# Patient Record
Sex: Female | Born: 1960
Health system: Southern US, Community
[De-identification: ages and names within clinical notes are randomized; demographics above are authoritative.]

## PROBLEM LIST (undated history)

## (undated) DIAGNOSIS — I10 Essential (primary) hypertension: Secondary | ICD-10-CM

## (undated) DIAGNOSIS — T7840XA Allergy, unspecified, initial encounter: Secondary | ICD-10-CM

## (undated) HISTORY — DX: Allergy, unspecified, initial encounter: T78.40XA

## (undated) HISTORY — PX: POLYPECTOMY: SHX149

## (undated) HISTORY — PX: BREAST BIOPSY: SHX20

## (undated) HISTORY — PX: BREAST SURGERY: SHX581

## (undated) HISTORY — PX: BREAST ENHANCEMENT SURGERY: SHX7

## (undated) HISTORY — PX: COLONOSCOPY: SHX174

## (undated) HISTORY — DX: Essential (primary) hypertension: I10

---

## 1998-11-09 ENCOUNTER — Other Ambulatory Visit: Admission: RE | Admit: 1998-11-09 | Discharge: 1998-11-09 | Payer: Self-pay | Admitting: Obstetrics and Gynecology

## 1999-05-19 ENCOUNTER — Other Ambulatory Visit: Admission: RE | Admit: 1999-05-19 | Discharge: 1999-05-19 | Payer: Self-pay | Admitting: *Deleted

## 1999-12-23 ENCOUNTER — Other Ambulatory Visit: Admission: RE | Admit: 1999-12-23 | Discharge: 1999-12-23 | Payer: Self-pay | Admitting: Obstetrics and Gynecology

## 2001-01-02 ENCOUNTER — Other Ambulatory Visit: Admission: RE | Admit: 2001-01-02 | Discharge: 2001-01-02 | Payer: Self-pay | Admitting: Obstetrics and Gynecology

## 2002-02-28 ENCOUNTER — Other Ambulatory Visit: Admission: RE | Admit: 2002-02-28 | Discharge: 2002-02-28 | Payer: Self-pay | Admitting: Obstetrics and Gynecology

## 2003-02-27 ENCOUNTER — Encounter: Payer: Self-pay | Admitting: Family Medicine

## 2003-02-27 ENCOUNTER — Ambulatory Visit (HOSPITAL_COMMUNITY): Admission: RE | Admit: 2003-02-27 | Discharge: 2003-02-27 | Payer: Self-pay | Admitting: Family Medicine

## 2003-03-03 ENCOUNTER — Encounter (HOSPITAL_COMMUNITY): Admission: RE | Admit: 2003-03-03 | Discharge: 2003-04-02 | Payer: Self-pay | Admitting: Family Medicine

## 2003-03-05 ENCOUNTER — Encounter: Payer: Self-pay | Admitting: Family Medicine

## 2004-03-08 ENCOUNTER — Other Ambulatory Visit: Admission: RE | Admit: 2004-03-08 | Discharge: 2004-03-08 | Payer: Self-pay | Admitting: Obstetrics and Gynecology

## 2011-05-12 ENCOUNTER — Encounter: Payer: Self-pay | Admitting: Internal Medicine

## 2011-05-12 ENCOUNTER — Ambulatory Visit (AMBULATORY_SURGERY_CENTER): Payer: 59 | Admitting: *Deleted

## 2011-05-12 VITALS — Ht 64.0 in | Wt 143.0 lb

## 2011-05-12 DIAGNOSIS — Z1211 Encounter for screening for malignant neoplasm of colon: Secondary | ICD-10-CM

## 2011-05-12 MED ORDER — PEG-KCL-NACL-NASULF-NA ASC-C 100 G PO SOLR: ORAL | Status: DC

## 2011-05-25 ENCOUNTER — Encounter: Payer: Self-pay | Admitting: Internal Medicine

## 2011-05-25 ENCOUNTER — Ambulatory Visit (AMBULATORY_SURGERY_CENTER): Payer: 59 | Admitting: Internal Medicine

## 2011-05-25 VITALS — BP 125/84 | HR 86 | Temp 99.1°F | Resp 18 | Ht 64.0 in | Wt 135.0 lb

## 2011-05-25 DIAGNOSIS — D126 Benign neoplasm of colon, unspecified: Secondary | ICD-10-CM

## 2011-05-25 DIAGNOSIS — Z1210 Encounter for screening for malignant neoplasm of intestinal tract, unspecified: Secondary | ICD-10-CM

## 2011-05-25 MED ORDER — SODIUM CHLORIDE 0.9 % IV SOLN: 500.0000 mL | INTRAVENOUS | Status: AC

## 2011-05-25 NOTE — Patient Instructions (Signed)
Findings:  Polyp  Recommendations:  Repeat colonoscopy in 5 years

## 2011-05-26 ENCOUNTER — Telehealth: Payer: Self-pay | Admitting: *Deleted

## 2011-05-26 NOTE — Telephone Encounter (Signed)

## 2013-08-02 ENCOUNTER — Encounter: Payer: Self-pay | Admitting: Family Medicine

## 2013-08-02 ENCOUNTER — Ambulatory Visit (INDEPENDENT_AMBULATORY_CARE_PROVIDER_SITE_OTHER): Payer: 59 | Admitting: Family Medicine

## 2013-08-02 VITALS — BP 122/88 | Ht 64.0 in | Wt 157.5 lb

## 2013-08-02 DIAGNOSIS — I10 Essential (primary) hypertension: Secondary | ICD-10-CM

## 2013-08-02 DIAGNOSIS — R002 Palpitations: Secondary | ICD-10-CM

## 2013-08-02 MED ORDER — METOPROLOL TARTRATE 25 MG PO TABS: ORAL_TABLET | ORAL | Status: DC

## 2013-08-02 NOTE — Progress Notes (Signed)
  Subjective:    Patient ID: Samantha George, female    DOB: 10/15/1961, 52 y.o.   MRN: [id number]  Hypertension This is a chronic problem. The current episode started more than 1 year ago. The problem has been gradually improving since onset. The problem is controlled. There are no associated agents to hypertension. There are no known risk factors for coronary artery disease. Treatments tried: metoprolol. The current treatment provides significant improvement. There are no compliance problems.   Patient states she has no other health concerns at this time.  Exercise going--t-mill and exercising retularly     Review of Systems No chest pain no back pain no shortness of breath compliant with medications. Blood pressures checked elsewhere at times related and other times normal.    Objective:   Physical Exam   Alert no acute distress. Lungs clear. Heart regular rate and rhythm. HEENT normal.     Assessment & Plan:  Impression #1 hypertension good control. #2 PVCs good control. Plan patient takes low-dose metoprolol initially primarily for PVCs but maintain for blood pressure. Generally just a half a tablet twice per day. Diet exercise discussed in encourage. Medications refilled. WSL

## 2013-08-04 DIAGNOSIS — I10 Essential (primary) hypertension: Secondary | ICD-10-CM | POA: Insufficient documentation

## 2013-08-04 DIAGNOSIS — R002 Palpitations: Secondary | ICD-10-CM | POA: Insufficient documentation

## 2014-08-27 ENCOUNTER — Encounter: Payer: Self-pay | Admitting: Family Medicine

## 2014-08-27 ENCOUNTER — Ambulatory Visit (INDEPENDENT_AMBULATORY_CARE_PROVIDER_SITE_OTHER): Payer: 59 | Admitting: Family Medicine

## 2014-08-27 VITALS — BP 120/78 | Ht 64.0 in | Wt 166.0 lb

## 2014-08-27 DIAGNOSIS — K219 Gastro-esophageal reflux disease without esophagitis: Secondary | ICD-10-CM

## 2014-08-27 DIAGNOSIS — R0789 Other chest pain: Secondary | ICD-10-CM

## 2014-08-27 DIAGNOSIS — I10 Essential (primary) hypertension: Secondary | ICD-10-CM

## 2014-08-27 DIAGNOSIS — R002 Palpitations: Secondary | ICD-10-CM

## 2014-08-27 MED ORDER — METOPROLOL TARTRATE 25 MG PO TABS: ORAL_TABLET | ORAL | Status: DC

## 2014-08-27 MED ORDER — RANITIDINE HCL 300 MG PO CAPS: 300.0000 mg | ORAL_CAPSULE | Freq: Every evening | ORAL | Status: DC

## 2014-08-27 MED ORDER — PANTOPRAZOLE SODIUM 40 MG PO TBEC: 40.0000 mg | DELAYED_RELEASE_TABLET | Freq: Every day | ORAL | Status: DC

## 2014-08-27 NOTE — Progress Notes (Signed)
   Subjective:    Patient ID: Samantha George, female    DOB: 11-Feb-1961, 53 y.o.   MRN: [id number]  Hypertension This is a chronic problem. Compliance problems include exercise.    Reflux worse at night while lying down. Burning in the middle of chest. Took zantac yesterday and feels better today.   Took a couple zantac  Exercise not good  No pain with exertion  Still has gall bladeder  Diet coke one or two or three per d  Felt slightly light headed  No viral infxn  Anti inflam prn     Burning pressure sens in the chest  Happening during the day  kinda sounded like reflux  Tried a few almonds  Then it restarted  Ate saltines   Declines flu vaccine today. Pt wants to wait til later in October.    Review of Systems See below    Objective:   Physical Exam  Alert no acute distress. Vitals stable. HEENT normal. Blood pressure good on repeat. Lungs clear. Heart regular in rhythm. Abdomen benign.  EKG normal sinus rhythm. No significant ST-T changes.      Assessment & Plan:  No headache no abdominal pain no change about habits no blood in stool next  Impression chest pain highly doubt cardiac etiology discussed at length. Most likely gastrointestinal with spasm secondary to reflux. #2 reflux discussed. Proton pump inhibitor to start and then H2-blocker to maintain. Rationale discussed. #3 hypertension good control plan as per orders. Maintain meds. Diet exercise discussed. WSL

## 2014-08-30 DIAGNOSIS — K219 Gastro-esophageal reflux disease without esophagitis: Secondary | ICD-10-CM | POA: Insufficient documentation

## 2015-03-30 ENCOUNTER — Ambulatory Visit (INDEPENDENT_AMBULATORY_CARE_PROVIDER_SITE_OTHER): Payer: 59 | Admitting: Family Medicine

## 2015-03-30 ENCOUNTER — Encounter: Payer: Self-pay | Admitting: Family Medicine

## 2015-03-30 VITALS — BP 136/88 | Temp 98.8°F | Ht 64.0 in | Wt 173.0 lb

## 2015-03-30 DIAGNOSIS — J329 Chronic sinusitis, unspecified: Secondary | ICD-10-CM

## 2015-03-30 DIAGNOSIS — J310 Chronic rhinitis: Secondary | ICD-10-CM

## 2015-03-30 MED ORDER — HYDROCODONE-HOMATROPINE 5-1.5 MG/5ML PO SYRP: ORAL_SOLUTION | ORAL | Status: DC

## 2015-03-30 MED ORDER — CEFDINIR 300 MG PO CAPS: 300.0000 mg | ORAL_CAPSULE | Freq: Two times a day (BID) | ORAL | Status: DC

## 2015-03-30 NOTE — Progress Notes (Signed)
   Subjective:    Patient ID: Samantha George, female    DOB: December 12, 1960, 54 y.o.   MRN: [id number]  Cough This is a new problem. Episode onset: 1 week. Associated symptoms include a sore throat. Treatments tried: clairtin, advil.    Started last wk with coiugh and runny nose  Due to fly soon, resp infxn f  Some press ure in ears  Bad cough  Morning sore throat present  Dim energy     Review of Systems  HENT: Positive for sore throat.   Respiratory: Positive for cough.     no vomiting no diarrhea    Objective:   Physical Exam   alert mild malaise. H&T frontal maxillary congestion fullness. Pharynx erythematous neck supple TMs normal lungs intermittent cough no wheezes no crackles heart regular in rhythm      Assessment & Plan:   impression viral syndrome with secondary rhinosinusitis plan antibiotics prescribed. Symptom care discussed. Intervention discussed with planned plane trip WSL

## 2015-09-01 ENCOUNTER — Other Ambulatory Visit: Payer: Self-pay | Admitting: Family Medicine

## 2015-09-01 NOTE — Telephone Encounter (Signed)
Ok one mo needs ov

## 2015-09-01 NOTE — Telephone Encounter (Signed)
Last chronic visit September 2015. May I refill?

## 2015-12-12 ENCOUNTER — Other Ambulatory Visit: Payer: Self-pay | Admitting: Family Medicine

## 2015-12-14 NOTE — Telephone Encounter (Signed)
Omne mo worth, rec f u visit to assess

## 2016-01-18 ENCOUNTER — Ambulatory Visit (INDEPENDENT_AMBULATORY_CARE_PROVIDER_SITE_OTHER): Payer: 59 | Admitting: Family Medicine

## 2016-01-18 ENCOUNTER — Encounter: Payer: Self-pay | Admitting: Family Medicine

## 2016-01-18 VITALS — BP 140/90 | Temp 98.3°F | Ht 64.0 in | Wt 164.5 lb

## 2016-01-18 DIAGNOSIS — I10 Essential (primary) hypertension: Secondary | ICD-10-CM

## 2016-01-18 MED ORDER — METOPROLOL TARTRATE 25 MG PO TABS: ORAL_TABLET | ORAL | Status: DC

## 2016-01-18 NOTE — Progress Notes (Signed)
   Subjective:    Patient ID: Samantha George, female    DOB: 03-Oct-1961, 55 y.o.   MRN: [id number]  Hypertension This is a chronic problem. The current episode started more than 1 year ago. The problem is uncontrolled. There are no associated agents to hypertension. There are no known risk factors for coronary artery disease. Treatments tried: metoprolol. The current treatment provides no improvement. There are no compliance problems.    Blood pressure has been running high lately. BP 120ish over 80ish, mostly when ck'ed elsewhere  150 over 102 in the surg's office, nmber still staye dup  Patient has no other concerns at this time.   Still elevated some   For sthe past four days has been on one tab bid since fricday   Hx of heart flutters, no obv fam hx of htn , but cad in fa plus smoker   Review of Systems No headache no chest pain no back pain abdominal pain ROS otherwise negative    Objective:   Physical Exam Alert vitals stable. HEENT normal lungs clear. Heart regular in rhythm.       Assessment & Plan:  Impression hypertension suboptimal control plan increase to full 25 mg twice a day long-term. Symptom care discussed prescriptions given WSL

## 2016-02-02 ENCOUNTER — Other Ambulatory Visit: Payer: Self-pay | Admitting: Plastic Surgery

## 2016-05-16 ENCOUNTER — Encounter: Payer: Self-pay | Admitting: Internal Medicine

## 2016-08-03 ENCOUNTER — Encounter: Payer: Self-pay | Admitting: Family Medicine

## 2016-08-03 ENCOUNTER — Ambulatory Visit (INDEPENDENT_AMBULATORY_CARE_PROVIDER_SITE_OTHER): Payer: 59 | Admitting: Family Medicine

## 2016-08-03 VITALS — BP 136/90 | Ht 64.0 in | Wt 168.1 lb

## 2016-08-03 DIAGNOSIS — R002 Palpitations: Secondary | ICD-10-CM

## 2016-08-03 DIAGNOSIS — Z79899 Other long term (current) drug therapy: Secondary | ICD-10-CM | POA: Diagnosis not present

## 2016-08-03 DIAGNOSIS — I10 Essential (primary) hypertension: Secondary | ICD-10-CM

## 2016-08-03 DIAGNOSIS — Z13220 Encounter for screening for lipoid disorders: Secondary | ICD-10-CM | POA: Diagnosis not present

## 2016-08-03 MED ORDER — ENALAPRIL MALEATE 5 MG PO TABS: 5.0000 mg | ORAL_TABLET | Freq: Every day | ORAL | 5 refills | Status: DC

## 2016-08-03 NOTE — Progress Notes (Signed)
   Subjective:    Patient ID: Samantha George, female    DOB: November 11, 1961, 55 y.o.   MRN: [id number]  Hypertension  This is a chronic problem. The current episode started more than 1 year ago. Associated symptoms include palpitations. There are no compliance problems.    Sometimes the bottom numb is less than 85, but most of the time higher  Noticing more palpitations  Jumping wends ation  Mild elevation  dcame thru ok  Trying to watch diet, Patient still notes palpitations at time. Notes it as a skipping sensation. Often feels a transient fullness in the chest. This concerns her a bit.  Her blood pressures been consistently elevated each time she checks.  Not checking blood pressures at home but at the drug store and elsewhere Patient states no other concerns this visit.  Review of Systems  Cardiovascular: Positive for palpitations.  No headache, no major weight loss or weight gain, no chest pain no back pain abdominal pain no change in bowel habits complete ROS otherwise negative      Objective:   Physical Exam Vital stable blood pressure 142/88 to 92 on repeat lungs clear. Heart regular in rhythm       Assessment & Plan:  Patient still notes palpitations at time. Impression 1 hypertension controlled suboptimum #2 PVCs intermittent somewhat bothersome plan a full 25 minutes spent in discussion with this patient about all of her options what's the best weight to proceed. The fullness from the palpitations is within normal limits I described the mechanism of this tumor. We could increase beta blocker but too much risk of fatigue. I discussed ways recommendations regarding blood pressure management. We will add a angiotensin cholinesterase inhibitor. Daily at bedtime. Maintain same dose of beta blocker rationale discussed. Patient will call us in several weeks. Otherwise follow-up in 6 months

## 2016-08-03 NOTE — Patient Instructions (Signed)
lifesource automated bp monitor

## 2016-12-12 ENCOUNTER — Encounter: Payer: Self-pay | Admitting: Internal Medicine

## 2017-01-22 ENCOUNTER — Other Ambulatory Visit: Payer: Self-pay | Admitting: Family Medicine

## 2017-02-10 ENCOUNTER — Ambulatory Visit (AMBULATORY_SURGERY_CENTER): Payer: Self-pay | Admitting: *Deleted

## 2017-02-10 VITALS — Ht 65.0 in | Wt 173.0 lb

## 2017-02-10 DIAGNOSIS — Z86010 Personal history of colonic polyps: Secondary | ICD-10-CM

## 2017-02-10 MED ORDER — NA SULFATE-K SULFATE-MG SULF 17.5-3.13-1.6 GM/177ML PO SOLN: 1.0000 | Freq: Once | ORAL | 0 refills | Status: AC

## 2017-02-10 NOTE — Progress Notes (Signed)
No egg or soy allergy known to patient  No issues with past sedation with any surgeries  or procedures, no intubation problems  No diet pills per patient No home 02 use per patient  No blood thinners per patient  Pt denies issues with constipation  No A fib or A flutter   

## 2017-02-13 ENCOUNTER — Other Ambulatory Visit: Payer: Self-pay | Admitting: Family Medicine

## 2017-02-13 ENCOUNTER — Encounter: Payer: Self-pay | Admitting: Internal Medicine

## 2017-02-21 ENCOUNTER — Ambulatory Visit (AMBULATORY_SURGERY_CENTER): Payer: 59 | Admitting: Internal Medicine

## 2017-02-21 ENCOUNTER — Encounter: Payer: Self-pay | Admitting: Internal Medicine

## 2017-02-21 VITALS — BP 113/74 | HR 58 | Temp 98.9°F | Resp 16 | Ht 65.0 in | Wt 173.0 lb

## 2017-02-21 DIAGNOSIS — Z86010 Personal history of colonic polyps: Secondary | ICD-10-CM | POA: Diagnosis present

## 2017-02-21 MED ORDER — SODIUM CHLORIDE 0.9 % IV SOLN: 500.0000 mL | INTRAVENOUS | Status: AC

## 2017-02-21 NOTE — Progress Notes (Signed)
To PACU VSS. Report to RN.tb 

## 2017-02-21 NOTE — Op Note (Signed)
[facility] Patient Name: Samantha George Procedure Date: 02/21/2017 1:26 PM MRN: [id number] Endoscopist: Docia Chuck. Henrene Pastor , MD Age: 56 Referring MD:  Date of Birth: 01-07-1961 Gender: Female Account #: [id number] Procedure:                Colonoscopy Indications:              High risk colon cancer surveillance: Personal                            history of sessile serrated colon polyp (less than                            10 mm in size) with no dysplasia. Previous exam                            June 2012 Medicines:                Monitored Anesthesia Care Procedure:                Pre-Anesthesia Assessment:                           - Prior to the procedure, a History and Physical                            was performed, and patient medications and                            allergies were reviewed. The patient's tolerance of                            previous anesthesia was also reviewed. The risks                            and benefits of the procedure and the sedation                            options and risks were discussed with the patient.                            All questions were answered, and informed consent                            was obtained. Prior Anticoagulants: The patient has                            taken no previous anticoagulant or antiplatelet                            agents. ASA Grade Assessment: II - A patient with                            mild systemic disease. After reviewing the risks  and benefits, the patient was deemed in                            satisfactory condition to undergo the procedure.                           After obtaining informed consent, the colonoscope                            was passed under direct vision. Throughout the                            procedure, the patient's blood pressure, pulse, and                            oxygen saturations were monitored continuously. The                             Colonoscope was introduced through the anus and                            advanced to the the cecum, identified by                            appendiceal orifice and ileocecal valve. The                            ileocecal valve, appendiceal orifice, and rectum                            were photographed. The quality of the bowel                            preparation was excellent. The colonoscopy was                            performed without difficulty. The patient tolerated                            the procedure well. The bowel preparation used was                            SUPREP. Scope In: 1:35:01 PM Scope Out: 1:46:27 PM Scope Withdrawal Time: 0 hours 9 minutes 54 seconds  Total Procedure Duration: 0 hours 11 minutes 26 seconds  Findings:                 The entire examined colon appeared normal on direct                            and retroflexion views. Complications:            No immediate complications. Estimated blood loss:                            None.  Estimated Blood Loss:     Estimated blood loss: none. Impression:               - The entire examined colon is normal on direct and                            retroflexion views.                           - No specimens collected. Recommendation:           - Repeat colonoscopy in 10 years for surveillance.                           - Patient has a contact number available for                            emergencies. The signs and symptoms of potential                            delayed complications were discussed with the                            patient. Return to normal activities tomorrow.                            Written discharge instructions were provided to the                            patient.                           - Resume previous diet.                           - Continue present medications. Docia Chuck. Henrene Pastor, MD 02/21/2017 1:50:07 PM This report has been signed  electronically.

## 2017-02-21 NOTE — Patient Instructions (Signed)
YOU HAD AN ENDOSCOPIC PROCEDURE TODAY AT THE [facility]:   Refer to the procedure report that was given to you for any specific questions about what was found during the examination.  If the procedure report does not answer your questions, please call your gastroenterologist to clarify.  If you requested that your care partner not be given the details of your procedure findings, then the procedure report has been included in a sealed envelope for you to review at your convenience later.  YOU SHOULD EXPECT: Some feelings of bloating in the abdomen. Passage of more gas than usual.  Walking can help get rid of the air that was put into your GI tract during the procedure and reduce the bloating. If you had a lower endoscopy (such as a colonoscopy or flexible sigmoidoscopy) you may notice spotting of blood in your stool or on the toilet paper. If you underwent a bowel prep for your procedure, you may not have a normal bowel movement for a few days.  Please Note:  You might notice some irritation and congestion in your nose or some drainage.  This is from the oxygen used during your procedure.  There is no need for concern and it should clear up in a day or so.  SYMPTOMS TO REPORT IMMEDIATELY:   Following lower endoscopy (colonoscopy or flexible sigmoidoscopy):  Excessive amounts of blood in the stool  Significant tenderness or worsening of abdominal pains  Swelling of the abdomen that is new, acute  Fever of 100F or higher  For urgent or emergent issues, a gastroenterologist can be reached at any hour by calling [phone].   DIET:  We do recommend a small meal at first, but then you may proceed to your regular diet.  Drink plenty of fluids but you should avoid alcoholic beverages for 24 hours.  ACTIVITY:  You should plan to take it easy for the rest of today and you should NOT DRIVE or use heavy machinery until tomorrow (because of the sedation medicines used during the test).     FOLLOW UP: Our staff will call the number listed on your records the next business day following your procedure to check on you and address any questions or concerns that you may have regarding the information given to you following your procedure. If we do not reach you, we will leave a message.  However, if you are feeling well and you are not experiencing any problems, there is no need to return our call.  We will assume that you have returned to your regular daily activities without incident.  If any biopsies were taken you will be contacted by phone or by letter within the next 1-3 weeks.  Please call us at [phone] if you have not heard about the biopsies in 3 weeks.    SIGNATURES/CONFIDENTIALITY: You and/or your care partner have signed paperwork which will be entered into your electronic medical record.  These signatures attest to the fact that that the information above on your After Visit Summary has been reviewed and is understood.  Full responsibility of the confidentiality of this discharge information lies with you and/or your care-partner. 

## 2017-02-22 ENCOUNTER — Telehealth: Payer: Self-pay

## 2017-02-22 NOTE — Telephone Encounter (Signed)
  Follow up Call-  Call back number 02/21/2017  Post procedure Call Back phone  # [phone]  Permission to leave phone message Yes  Some recent data might be hidden     Patient questions:  Do you have a fever, pain , or abdominal swelling? No. Pain Score  0 *  Have you tolerated food without any problems? Yes.    Have you been able to return to your normal activities? Yes.    Do you have any questions about your discharge instructions: Diet   No. Medications  No. Follow up visit  No.  Do you have questions or concerns about your Care? No.  Actions: * If pain score is 4 or above: No action needed, pain <4.

## 2017-03-16 ENCOUNTER — Other Ambulatory Visit: Payer: Self-pay | Admitting: Family Medicine

## 2017-04-03 ENCOUNTER — Other Ambulatory Visit: Payer: Self-pay | Admitting: Family Medicine

## 2017-04-14 DIAGNOSIS — I10 Essential (primary) hypertension: Secondary | ICD-10-CM | POA: Diagnosis not present

## 2017-05-17 DIAGNOSIS — I10 Essential (primary) hypertension: Secondary | ICD-10-CM | POA: Diagnosis not present

## 2017-05-19 DIAGNOSIS — N951 Menopausal and female climacteric states: Secondary | ICD-10-CM | POA: Diagnosis not present

## 2017-05-19 DIAGNOSIS — I10 Essential (primary) hypertension: Secondary | ICD-10-CM | POA: Diagnosis not present

## 2017-05-19 DIAGNOSIS — R002 Palpitations: Secondary | ICD-10-CM | POA: Diagnosis not present

## 2017-05-22 DIAGNOSIS — J069 Acute upper respiratory infection, unspecified: Secondary | ICD-10-CM | POA: Diagnosis not present

## 2017-05-24 DIAGNOSIS — R05 Cough: Secondary | ICD-10-CM | POA: Diagnosis not present

## 2017-05-24 DIAGNOSIS — J0100 Acute maxillary sinusitis, unspecified: Secondary | ICD-10-CM | POA: Diagnosis not present

## 2017-06-06 DIAGNOSIS — Z1231 Encounter for screening mammogram for malignant neoplasm of breast: Secondary | ICD-10-CM | POA: Diagnosis not present

## 2017-07-11 DIAGNOSIS — Z01419 Encounter for gynecological examination (general) (routine) without abnormal findings: Secondary | ICD-10-CM | POA: Diagnosis not present

## 2017-11-14 DIAGNOSIS — I10 Essential (primary) hypertension: Secondary | ICD-10-CM | POA: Diagnosis not present

## 2017-11-14 DIAGNOSIS — R002 Palpitations: Secondary | ICD-10-CM | POA: Diagnosis not present

## 2017-12-03 ENCOUNTER — Encounter (HOSPITAL_COMMUNITY): Payer: Self-pay | Admitting: *Deleted

## 2017-12-03 ENCOUNTER — Other Ambulatory Visit: Payer: Self-pay

## 2017-12-03 ENCOUNTER — Emergency Department (HOSPITAL_COMMUNITY): Payer: 59

## 2017-12-03 DIAGNOSIS — R079 Chest pain, unspecified: Secondary | ICD-10-CM | POA: Insufficient documentation

## 2017-12-03 DIAGNOSIS — R0789 Other chest pain: Secondary | ICD-10-CM | POA: Diagnosis not present

## 2017-12-03 DIAGNOSIS — Z79899 Other long term (current) drug therapy: Secondary | ICD-10-CM | POA: Insufficient documentation

## 2017-12-03 DIAGNOSIS — I10 Essential (primary) hypertension: Secondary | ICD-10-CM | POA: Insufficient documentation

## 2017-12-03 LAB — CBC
HEMATOCRIT: 40.8 % (ref 36.0–46.0)
HEMOGLOBIN: 13.2 g/dL (ref 12.0–15.0)
MCH: 30.4 pg (ref 26.0–34.0)
MCHC: 32.4 g/dL (ref 30.0–36.0)
MCV: 94 fL (ref 78.0–100.0)
Platelets: 261 10*3/uL (ref 150–400)
RBC: 4.34 MIL/uL (ref 3.87–5.11)
RDW: 12.9 % (ref 11.5–15.5)
WBC: 6.6 10*3/uL (ref 4.0–10.5)

## 2017-12-03 LAB — BASIC METABOLIC PANEL
ANION GAP: 11 (ref 5–15)
BUN: 22 mg/dL — ABNORMAL HIGH (ref 6–20)
CHLORIDE: 102 mmol/L (ref 101–111)
CO2: 24 mmol/L (ref 22–32)
Calcium: 9.3 mg/dL (ref 8.9–10.3)
Creatinine, Ser: 0.72 mg/dL (ref 0.44–1.00)
GFR calc non Af Amer: >60 mL/min (ref >60)
Glucose, Bld: 128 mg/dL — ABNORMAL HIGH (ref 65–99)
POTASSIUM: 3.3 mmol/L — AB (ref 3.5–5.1)
SODIUM: 137 mmol/L (ref 135–145)

## 2017-12-03 LAB — TROPONIN I: Troponin I: <0.03 ng/mL (ref <0.03)

## 2017-12-03 NOTE — ED Triage Notes (Signed)
Pt reports pressure in her chest since this afternoon. Pt denies any other symptoms.

## 2017-12-04 ENCOUNTER — Emergency Department (HOSPITAL_COMMUNITY)
Admission: EM | Admit: 2017-12-04 | Discharge: 2017-12-04 | Disposition: A | Discharge status: Home / Self Care | Payer: 59 | Attending: Emergency Medicine | Admitting: Emergency Medicine

## 2017-12-04 DIAGNOSIS — R0789 Other chest pain: Secondary | ICD-10-CM

## 2017-12-04 LAB — TROPONIN I

## 2017-12-04 MED ORDER — NITROGLYCERIN 0.4 MG SL SUBL
0.4000 mg | SUBLINGUAL_TABLET | SUBLINGUAL | Status: DC | PRN
  Administered 2017-12-04: 0.4 mg via SUBLINGUAL
  Filled 2017-12-04: qty 1

## 2017-12-04 MED ORDER — ASPIRIN 81 MG PO CHEW
324.0000 mg | CHEWABLE_TABLET | Freq: Once | ORAL | Status: AC
  Administered 2017-12-04: 324 mg via ORAL
  Filled 2017-12-04: qty 4

## 2017-12-04 MED ORDER — NITROGLYCERIN 0.4 MG SL SUBL: 0.4000 mg | SUBLINGUAL_TABLET | SUBLINGUAL | Status: DC | PRN

## 2017-12-04 MED ORDER — NITROGLYCERIN 0.4 MG SL SUBL: 0.4000 mg | SUBLINGUAL_TABLET | SUBLINGUAL | 0 refills | Status: AC | PRN

## 2017-12-04 MED ORDER — POTASSIUM CHLORIDE CRYS ER 20 MEQ PO TBCR
40.0000 meq | EXTENDED_RELEASE_TABLET | Freq: Once | ORAL | Status: AC
  Administered 2017-12-04: 40 meq via ORAL
  Filled 2017-12-04: qty 2

## 2017-12-04 NOTE — Discharge Instructions (Signed)
Take aspirin 81 mg once a day. If you ever have chest discomfort that does not get better with three nitroglycerin tablets, then come to the emergency department immediately!

## 2017-12-04 NOTE — ED Provider Notes (Signed)
[facility] EMERGENCY DEPARTMENT Provider Note   CSN: [id number] Arrival date & time: 12/03/17  2158      History   Chief Complaint Chief Complaint  Patient presents with  . Chest Pain    HPI Samantha George is a 57 y.o. female.  The history is provided by the patient.  She has a history of hypertension and palpitations, and comes in because of tight feeling in her chest since this afternoon.  This came on while she was doing some household chores.  There is no radiation of the discomfort and it is relatively mild and she rates it at 2/10.  Nothing makes it better, nothing makes it worse.  It is not affected by exertion or body position.  There is no associated dyspnea, nausea, diaphoresis.  She has not had similar symptoms before.  She is a non-smoker and denies history of diabetes or hyperlipidemia.  Father died of an MI at age 34, but was a heavy smoker.  Past Medical History:  Diagnosis Date  . Allergy    seasonal  . Hypertension     Patient Active Problem List   Diagnosis Date Noted  . Esophageal reflux 08/30/2014  . Essential hypertension, benign 08/04/2013  . Palpitations 08/04/2013    Past Surgical History:  Procedure Laterality Date  . BREAST BIOPSY     left/benign  . BREAST ENHANCEMENT SURGERY    . BREAST SURGERY     implants removed  . CESAREAN SECTION    . COLONOSCOPY    . POLYPECTOMY      OB History    No data available       Home Medications    Prior to Admission medications   Medication Sig Start Date End Date Taking? Authorizing Provider  enalapril (VASOTEC) 5 MG tablet take 1 tablet by mouth every morning 04/03/17   Mikey Kirschner, MD  metoprolol tartrate (LOPRESSOR) 25 MG tablet take 1/2 to 1 tablet by mouth twice a day 03/17/17   Mikey Kirschner, MD  Probiotic Product (PROBIOTIC DAILY PO) Take by mouth daily. Reported on 01/18/2016    [provider]    Family History Family History  Problem Relation Age of Onset  . Stomach  cancer Maternal Grandfather   . Colon cancer Neg Hx   . Colon polyps Neg Hx   . Rectal cancer Neg Hx     Social History Social History   Tobacco Use  . Smoking status: Never Smoker  . Smokeless tobacco: Never Used  Substance Use Topics  . Alcohol use: Yes    Alcohol/week: 0.0 oz    Comment: socially on weekends   . Drug use: No     Allergies   Patient has no known allergies.   Review of Systems Review of Systems  All other systems reviewed and are negative.    Physical Exam Updated Vital Signs BP (!) 145/100 (BP Location: Right Arm)   Pulse (!) 113   Temp 98.5 F (36.9 C) (Oral)   Resp 20   Ht 5\' 4"  (1.626 m)   Wt 79.1 kg (174 lb 4.8 oz)   LMP 05/06/2011   SpO2 98%   BMI 29.92 kg/m   Physical Exam  Nursing note and vitals reviewed.   57 year old female, resting comfortably and in no acute distress. Vital signs are significant for hypertension and tachycardia. Oxygen saturation is 98%, which is normal. Head is normocephalic and atraumatic. PERRLA, EOMI. Oropharynx is clear. Neck is nontender  and supple without adenopathy or JVD. Back is nontender and there is no CVA tenderness. Lungs are clear without rales, wheezes, or rhonchi. Chest is nontender. Heart has regular rate and rhythm without murmur. Abdomen is soft, flat, nontender without masses or hepatosplenomegaly and peristalsis is normoactive. Extremities have no cyanosis or edema, full range of motion is present. Skin is warm and dry without rash. Neurologic: Mental status is normal, cranial nerves are intact, there are no motor or sensory deficits.  ED Treatments / Results  Labs (all labs ordered are listed, but only abnormal results are displayed) Labs Reviewed  BASIC METABOLIC PANEL - Abnormal; Notable for the following components:      Result Value   Potassium 3.3 (*)    Glucose, Bld 128 (*)    BUN 22 (*)    All other components within normal limits  CBC  TROPONIN I  TROPONIN I    EKG  Interpretation  Date/Time:  Sunday December 03 2017 22:40:49 EST Ventricular Rate:  72 PR Interval:  184 QRS Duration: 90 QT Interval:  442 QTC Calculation: 483 R Axis:   13 Text Interpretation:  Sinus rhythm Inferior infarct , age undetermined Anterior infarct , age undetermined No old tracing to compare Reconfirmed by Delora Fuel ([id number]) on 12/04/2017 4:31:09 AM        Radiology Dg Chest 2 View  Result Date: 12/04/2017 CLINICAL DATA:  Chest pressure since this afternoon. EXAM: CHEST  2 VIEW COMPARISON:  None. FINDINGS: The heart size and mediastinal contours are within normal limits. Tortuous thoracic aorta without aneurysm. Both lungs are clear. The visualized skeletal structures are unremarkable. IMPRESSION: No active cardiopulmonary disease. Mild uncoiling of the thoracic aorta without aneurysm. Electronically Signed   By: Ashley Royalty M.D.   On: 12/04/2017 00:02    Procedures Procedures (including critical care time)  Medications Ordered in ED Medications  nitroGLYCERIN (NITROSTAT) SL tablet 0.4 mg (0.4 mg Sublingual Given 12/04/17 0438)  nitroGLYCERIN (NITROSTAT) SL tablet 0.4 mg (not administered)  aspirin chewable tablet 324 mg (324 mg Oral Given 12/04/17 0436)  potassium chloride SA (K-DUR,KLOR-CON) CR tablet 40 mEq (40 mEq Oral Given 12/04/17 0436)     Initial Impression / Assessment and Plan / ED Course  I have reviewed the triage vital signs and the nursing notes.  Pertinent labs & imaging results that were available during my care of the patient were reviewed by me and considered in my medical decision making (see chart for details).  Chest discomfort of uncertain cause.  Heart score is 3, which puts her at low risk for major adverse cardiac events in the next 30 days.  Will check repeat troponin, give aspirin and give therapeutic trial of nitroglycerin.  She had relief of discomfort with nitroglycerin.  Repeat troponin is normal.  Patient expressed desire to leave.  I  have explained to her that even with negative ECG and 2- heart enzymes, coronary artery disease is still a possibility.  She expresses understanding.  She is instructed to take aspirin daily and is given a prescription for nitroglycerin with strict return precautions given.  Referred to cardiology for outpatient stress testing.  Final Clinical Impressions(s) / ED Diagnoses   Final diagnoses:  Chest discomfort    ED Discharge Orders    None       Delora Fuel, MD 54/56/25 [id number]

## 2017-12-04 NOTE — ED Notes (Signed)
Pt reporting continues pressure/fullnes in the center of her chest.

## 2017-12-04 NOTE — ED Notes (Signed)
This RN called Tori, lab, and asked her to come draw this pt's blood due to her being a chest pain and advised her that I had another chest pain in the waiting area. Tori, drew both pt's blood.

## 2017-12-06 DIAGNOSIS — I10 Essential (primary) hypertension: Secondary | ICD-10-CM | POA: Diagnosis not present

## 2018-01-15 ENCOUNTER — Encounter: Payer: Self-pay | Admitting: Internal Medicine

## 2018-01-15 ENCOUNTER — Ambulatory Visit (INDEPENDENT_AMBULATORY_CARE_PROVIDER_SITE_OTHER): Payer: 59 | Admitting: Internal Medicine

## 2018-01-15 VITALS — BP 124/78 | HR 76 | Ht 64.0 in | Wt 177.0 lb

## 2018-01-15 DIAGNOSIS — R0789 Other chest pain: Secondary | ICD-10-CM

## 2018-01-15 DIAGNOSIS — R002 Palpitations: Secondary | ICD-10-CM

## 2018-01-15 DIAGNOSIS — E782 Mixed hyperlipidemia: Secondary | ICD-10-CM | POA: Diagnosis not present

## 2018-01-15 DIAGNOSIS — I10 Essential (primary) hypertension: Secondary | ICD-10-CM

## 2018-01-15 MED ORDER — DILTIAZEM HCL 30 MG PO TABS: 30.0000 mg | ORAL_TABLET | Freq: Two times a day (BID) | ORAL | 6 refills | Status: DC

## 2018-01-15 MED ORDER — BYSTOLIC 10 MG PO TABS: 10.0000 mg | ORAL_TABLET | Freq: Every day | ORAL | 3 refills | Status: DC

## 2018-01-15 NOTE — Progress Notes (Signed)
Cardiology Office Note   Date:  01/15/2018   ID:  Samantha George, DOB June 22, 1961, MRN [id number]  PCP:  Celene Squibb, MD  Cardiologist:   Dorris Carnes, MD    Pt referred by Dr Nevada Crane for f/u of chest discomfort     History of Present Illness: Samantha George is a 57 y.o. female with a history of HTN and palpitations  She was seen in ED on 12/04/17 with chest tightness   Occurred while doing chores.  Mild 2/10  Describes as a heaviness  Some fluttering (fleeting)Not aossciated with change in positon or activity level  Lasted hours  No SOB     Seen by Dr Alvester Chou increased to 20 due to increased BP  She has not started. Still some fluttering and some tightness.  Continues daily activites  Travells a lot  Spells not exacerbated by actvitiy  Have not slowed her   Notes still some tightness an fluttering  She continues tht sam Father (a heavy smoker) died at 57 of MI     Current Meds  Medication Sig  . aspirin EC 81 MG tablet Take 81 mg by mouth daily.  . enalapril (VASOTEC) 5 MG tablet take 1 tablet by mouth every morning  . metoprolol tartrate (LOPRESSOR) 25 MG tablet take 1/2 to 1 tablet by mouth twice a day  . nitroGLYCERIN (NITROSTAT) 0.4 MG SL tablet Place 1 tablet (0.4 mg total) under the tongue every 5 (five) minutes as needed for chest pain.  . Probiotic Product (PROBIOTIC DAILY PO) Take by mouth daily. Reported on 01/18/2016   Current Facility-Administered Medications for the 01/15/18 encounter (Office Visit) with Fay Records, MD  Medication  . 0.9 %  sodium chloride infusion  . 0.9 %  sodium chloride infusion     Allergies:   Patient has no known allergies.   Past Medical History:  Diagnosis Date  . Allergy    seasonal  . Hypertension     Past Surgical History:  Procedure Laterality Date  . BREAST BIOPSY     left/benign  . BREAST ENHANCEMENT SURGERY    . BREAST SURGERY     implants removed  . CESAREAN SECTION    . COLONOSCOPY    . POLYPECTOMY       Social  History:  The patient  reports that  has never smoked. she has never used smokeless tobacco. She reports that she drinks alcohol. She reports that she does not use drugs.   Family History:  The patient's family history includes Stomach cancer in her maternal grandfather.     Dad had COPD   MI at 74  Died       ROS:  Please see the history of present illness. All other systems are reviewed and  Negative to the above problem except as noted.    PHYSICAL EXAM: VS:  BP 124/78 (BP Location: Left Arm)   Pulse 76   Ht 5\' 4"  (1.626 m)   Wt 177 lb (80.3 kg)   LMP 05/06/2011   SpO2 96%   BMI 30.38 kg/m   GEN: Well nourished, well developed, in no acute distress  HEENT: normal  Neck: no JVD, carotid bruits, or masses Cardiac: RRR; no murmurs, rubs, or gallops,no edema  Respiratory:  clear to auscultation bilaterally, normal work of breathing GI: soft, nontender, nondistended, + BS  No hepatomegaly  MS: no deformity Moving all extremities   Skin: warm and dry, no rash Neuro:  Strength and sensation are intact Psych: euthymic mood, full affect   EKG:  EKG is not ordered today.  From ED on 12/03/17 SR with nonspecific ST changes     Lipid Panel No results found for: CHOL, TRIG, HDL, CHOLHDL, VLDL, LDLCALC, LDLDIRECT    Wt Readings from Last 3 Encounters:  01/15/18 177 lb (80.3 kg)  12/03/17 174 lb 4.8 oz (79.1 kg)  02/21/17 173 lb (78.5 kg)      ASSESSMENT AND PLAN:  1  Chest tightness  Atypical for ischemia  Not associated with activity  Occasionally associated with palpitations   ? GI   I encouraged her to stay active   Follow   For palpitatoins I recomm trial of dilt 30 bid  Will see if this helps tightness  2  Palpitaitons   Occasional  Not hemodynamically destabilizing   Try low dose dilt  Follow  Stay adequately hydrated   Avoid caffeine  3  HTN  Follow BP  Good today  Keep on Bystolic for now 10 mg  4  Lipids   Check lipids today    Pt will communicate on my chart  response to changes  This will guide furhter Rx    Current medicines are reviewed at length with the patient today.  The patient does not have concerns regarding medicines.  Signed, Dorris Carnes, MD  01/15/2018 9:05 AM    [address], [place], [place]  [address] Phone: [phone]; Fax: [phone]

## 2018-01-15 NOTE — Patient Instructions (Signed)
Your physician recommends that you schedule a follow-up appointment in: to be determined per Dr Harrington Challenger    STOP Aspirin   START Diltiazem 30 mg twice a day   [place] lab work    No tests ordered today      Thank you for choosing [facility] !

## 2018-01-16 ENCOUNTER — Other Ambulatory Visit (HOSPITAL_COMMUNITY)
Admission: RE | Admit: 2018-01-16 | Discharge: 2018-01-16 | Disposition: A | Discharge status: Home / Self Care | Payer: 59 | Source: Ambulatory Visit | Attending: Internal Medicine | Admitting: Internal Medicine

## 2018-01-16 DIAGNOSIS — E782 Mixed hyperlipidemia: Secondary | ICD-10-CM | POA: Diagnosis present

## 2018-01-16 LAB — LIPID PANEL
CHOL/HDL RATIO: 3.2 ratio
CHOLESTEROL: 209 mg/dL — AB (ref 0–200)
HDL: 65 mg/dL (ref >40)
LDL Cholesterol: 123 mg/dL — ABNORMAL HIGH (ref 0–99)
TRIGLYCERIDES: 103 mg/dL (ref <150)
VLDL: 21 mg/dL (ref 0–40)

## 2018-01-21 ENCOUNTER — Encounter: Payer: Self-pay | Admitting: Internal Medicine

## 2018-02-02 ENCOUNTER — Telehealth: Payer: Self-pay | Admitting: Internal Medicine

## 2018-02-02 MED ORDER — DILTIAZEM HCL ER COATED BEADS 120 MG PO CP24: 120.0000 mg | ORAL_CAPSULE | Freq: Every day | ORAL | 3 refills | Status: DC

## 2018-02-02 MED ORDER — NEBIVOLOL HCL 5 MG PO TABS: 5.0000 mg | ORAL_TABLET | Freq: Every day | ORAL | 3 refills | Status: DC

## 2018-02-02 NOTE — Telephone Encounter (Signed)
Called patient. Diltiazem er is affordable.

## 2018-02-02 NOTE — Telephone Encounter (Signed)
Left message for patient to call back  

## 2018-02-02 NOTE — Telephone Encounter (Signed)
Reviewed with patient. She will go by her pharmacy and check on price of diltiazem CD 120.  I asked her to call back when she knows and at that time I will update her medicine list.  Voices understanding of planned med changes.

## 2018-02-02 NOTE — Telephone Encounter (Signed)
New message      *STAT* If patient is at the pharmacy, call can be transferred to refill team.   1. Which medications need to be refilled? (please list name of each medication and dose if known) BYSTOLIC 10 MG tablet  2. Which pharmacy/location (including street and city if local pharmacy) is medication to be sent to?[facility] [phone] - [place], [address] AT [address]  3. Do they need a 30 day or 90 day supply? [place]

## 2018-02-02 NOTE — Telephone Encounter (Signed)
  °*  STAT* If patient is at the pharmacy, call can be transferred to refill team.   1. Which medications need to be refilled? (please list name of each medication and dose if known) BYSTOLIC 10 MG tablet  2. Which pharmacy/location (including street and city if local pharmacy) is medication to be sent to?[facility] [phone] - [place], [address] AT [address]  3. Do they need a 30 day or 90 day supply? Samantha George

## 2018-02-02 NOTE — Addendum Note (Signed)
Addended by: Rodman Key on: 02/02/2018 11:12 AM   Modules accepted: Orders

## 2018-02-02 NOTE — Telephone Encounter (Signed)
Updated medicine list at this time. Pt now to take diltiazem ER 120 mg daily and 5 mg bystolic daily.

## 2018-02-02 NOTE — Telephone Encounter (Signed)
Pt wrote in  Feeling better with few skips after additoin of diltiazem 30 bid  SHe is on Bystolic   I would recomm:   Cut bystolic to 5 mg per day SHe can increase diltiazem to 120 mg CD daily (have her check cost to make sure not too much before switching  Unfortunately this drug can be expensive  If it is then   I would recomm keeping on current regimen with current med dosing

## 2018-02-02 NOTE — Telephone Encounter (Signed)
Mrs. Tomson is calling because the pharmacy is stating that they dont have the Bystolic request for refill . Please call

## 2018-02-08 DIAGNOSIS — T753XXA Motion sickness, initial encounter: Secondary | ICD-10-CM | POA: Diagnosis not present

## 2018-02-08 DIAGNOSIS — R002 Palpitations: Secondary | ICD-10-CM | POA: Diagnosis not present

## 2018-02-08 DIAGNOSIS — I10 Essential (primary) hypertension: Secondary | ICD-10-CM | POA: Diagnosis not present

## 2018-02-09 ENCOUNTER — Encounter: Payer: Self-pay | Admitting: Internal Medicine

## 2018-04-14 DIAGNOSIS — R109 Unspecified abdominal pain: Secondary | ICD-10-CM | POA: Diagnosis not present

## 2018-04-14 DIAGNOSIS — R509 Fever, unspecified: Secondary | ICD-10-CM | POA: Diagnosis not present

## 2018-04-25 DIAGNOSIS — R51 Headache: Secondary | ICD-10-CM | POA: Diagnosis not present

## 2018-04-25 DIAGNOSIS — R002 Palpitations: Secondary | ICD-10-CM | POA: Diagnosis not present

## 2018-04-25 DIAGNOSIS — R109 Unspecified abdominal pain: Secondary | ICD-10-CM | POA: Diagnosis not present

## 2018-04-30 DIAGNOSIS — Z0000 Encounter for general adult medical examination without abnormal findings: Secondary | ICD-10-CM | POA: Diagnosis not present

## 2018-04-30 DIAGNOSIS — R002 Palpitations: Secondary | ICD-10-CM | POA: Diagnosis not present

## 2018-04-30 DIAGNOSIS — I10 Essential (primary) hypertension: Secondary | ICD-10-CM | POA: Diagnosis not present

## 2018-06-15 DIAGNOSIS — Z1231 Encounter for screening mammogram for malignant neoplasm of breast: Secondary | ICD-10-CM | POA: Diagnosis not present

## 2018-07-18 DIAGNOSIS — Z01419 Encounter for gynecological examination (general) (routine) without abnormal findings: Secondary | ICD-10-CM | POA: Diagnosis not present

## 2018-07-24 DIAGNOSIS — R635 Abnormal weight gain: Secondary | ICD-10-CM | POA: Diagnosis not present

## 2018-07-24 DIAGNOSIS — N951 Menopausal and female climacteric states: Secondary | ICD-10-CM | POA: Diagnosis not present

## 2018-07-31 DIAGNOSIS — R7303 Prediabetes: Secondary | ICD-10-CM | POA: Diagnosis not present

## 2018-08-08 DIAGNOSIS — E663 Overweight: Secondary | ICD-10-CM | POA: Diagnosis not present

## 2018-08-08 DIAGNOSIS — R7303 Prediabetes: Secondary | ICD-10-CM | POA: Diagnosis not present

## 2018-08-13 DIAGNOSIS — E663 Overweight: Secondary | ICD-10-CM | POA: Diagnosis not present

## 2018-08-13 DIAGNOSIS — E782 Mixed hyperlipidemia: Secondary | ICD-10-CM | POA: Diagnosis not present

## 2018-08-23 DIAGNOSIS — E663 Overweight: Secondary | ICD-10-CM | POA: Diagnosis not present

## 2018-08-23 DIAGNOSIS — E782 Mixed hyperlipidemia: Secondary | ICD-10-CM | POA: Diagnosis not present

## 2018-08-28 DIAGNOSIS — R7303 Prediabetes: Secondary | ICD-10-CM | POA: Diagnosis not present

## 2018-08-28 DIAGNOSIS — E663 Overweight: Secondary | ICD-10-CM | POA: Diagnosis not present

## 2018-08-28 DIAGNOSIS — N898 Other specified noninflammatory disorders of vagina: Secondary | ICD-10-CM | POA: Diagnosis not present

## 2018-08-28 DIAGNOSIS — E782 Mixed hyperlipidemia: Secondary | ICD-10-CM | POA: Diagnosis not present

## 2018-08-28 DIAGNOSIS — R6882 Decreased libido: Secondary | ICD-10-CM | POA: Diagnosis not present

## 2018-08-28 DIAGNOSIS — R5383 Other fatigue: Secondary | ICD-10-CM | POA: Diagnosis not present

## 2018-09-01 DIAGNOSIS — Z23 Encounter for immunization: Secondary | ICD-10-CM | POA: Diagnosis not present

## 2018-09-04 DIAGNOSIS — Z72820 Sleep deprivation: Secondary | ICD-10-CM | POA: Diagnosis not present

## 2018-09-04 DIAGNOSIS — R232 Flushing: Secondary | ICD-10-CM | POA: Diagnosis not present

## 2018-09-04 DIAGNOSIS — N951 Menopausal and female climacteric states: Secondary | ICD-10-CM | POA: Diagnosis not present

## 2018-09-11 DIAGNOSIS — E782 Mixed hyperlipidemia: Secondary | ICD-10-CM | POA: Diagnosis not present

## 2018-09-11 DIAGNOSIS — E663 Overweight: Secondary | ICD-10-CM | POA: Diagnosis not present

## 2018-09-18 DIAGNOSIS — R7303 Prediabetes: Secondary | ICD-10-CM | POA: Diagnosis not present

## 2018-09-18 DIAGNOSIS — E663 Overweight: Secondary | ICD-10-CM | POA: Diagnosis not present

## 2018-09-27 DIAGNOSIS — I10 Essential (primary) hypertension: Secondary | ICD-10-CM | POA: Diagnosis not present

## 2018-09-27 DIAGNOSIS — E663 Overweight: Secondary | ICD-10-CM | POA: Diagnosis not present

## 2018-09-27 DIAGNOSIS — E782 Mixed hyperlipidemia: Secondary | ICD-10-CM | POA: Diagnosis not present

## 2018-10-09 DIAGNOSIS — E663 Overweight: Secondary | ICD-10-CM | POA: Diagnosis not present

## 2018-10-09 DIAGNOSIS — I10 Essential (primary) hypertension: Secondary | ICD-10-CM | POA: Diagnosis not present

## 2018-10-16 DIAGNOSIS — E663 Overweight: Secondary | ICD-10-CM | POA: Diagnosis not present

## 2018-10-16 DIAGNOSIS — R7303 Prediabetes: Secondary | ICD-10-CM | POA: Diagnosis not present

## 2018-10-23 DIAGNOSIS — I10 Essential (primary) hypertension: Secondary | ICD-10-CM | POA: Diagnosis not present

## 2018-10-23 DIAGNOSIS — E663 Overweight: Secondary | ICD-10-CM | POA: Diagnosis not present

## 2018-10-23 DIAGNOSIS — R7303 Prediabetes: Secondary | ICD-10-CM | POA: Diagnosis not present

## 2018-11-05 DIAGNOSIS — R7301 Impaired fasting glucose: Secondary | ICD-10-CM | POA: Diagnosis not present

## 2018-11-05 DIAGNOSIS — I10 Essential (primary) hypertension: Secondary | ICD-10-CM | POA: Diagnosis not present

## 2018-11-05 DIAGNOSIS — E782 Mixed hyperlipidemia: Secondary | ICD-10-CM | POA: Diagnosis not present

## 2018-11-06 DIAGNOSIS — Z6826 Body mass index (BMI) 26.0-26.9, adult: Secondary | ICD-10-CM | POA: Diagnosis not present

## 2018-11-06 DIAGNOSIS — I10 Essential (primary) hypertension: Secondary | ICD-10-CM | POA: Diagnosis not present

## 2018-11-12 DIAGNOSIS — R002 Palpitations: Secondary | ICD-10-CM | POA: Diagnosis not present

## 2018-11-12 DIAGNOSIS — I10 Essential (primary) hypertension: Secondary | ICD-10-CM | POA: Diagnosis not present

## 2018-11-12 DIAGNOSIS — E782 Mixed hyperlipidemia: Secondary | ICD-10-CM | POA: Diagnosis not present

## 2019-02-13 ENCOUNTER — Other Ambulatory Visit: Payer: Self-pay | Admitting: Internal Medicine

## 2019-02-20 ENCOUNTER — Other Ambulatory Visit: Payer: Self-pay

## 2019-02-20 ENCOUNTER — Telehealth: Payer: Self-pay | Admitting: Internal Medicine

## 2019-02-20 NOTE — Telephone Encounter (Signed)
New message   Pt c/o medication issue:  1. Name of [place]   2. How are you currently taking this medication (dosage and times per day)?N/a  3. Are you having a reaction (difficulty breathing--STAT)?n/a  4. What is your medication issue?patient states that [employer] Falling needs prior authorization before it can be covered.

## 2019-02-21 NOTE — Telephone Encounter (Signed)
Follow up    Pt c/o medication issue:  1. Name of Medication: Bystolic 5MG   2. How are you currently taking this medication (dosage and times per day)? Take 1 tablet (5 mg total) by mouth daily. Please make annual appt for future refills. 3. Are you having a reaction (difficulty breathing--STAT)?  4. What is your medication issue? Patient states that [employer] Falling is requesting a prior authorization.

## 2019-02-22 MED ORDER — NEBIVOLOL HCL 5 MG PO TABS: 5.0000 mg | ORAL_TABLET | Freq: Every day | ORAL | 2 refills | Status: DC

## 2019-02-22 NOTE — Telephone Encounter (Signed)
**Note De-Identified Euphemia Lingerfelt Obfuscation** The pt is seen in our [place] office. Will forward message to that office.

## 2019-02-22 NOTE — Telephone Encounter (Signed)
New Message:   Samantha George needs a prior authorization for her Bystolic. She says she needs this asap please.

## 2019-02-22 NOTE — Telephone Encounter (Signed)
Called pharmacy. Left message.

## 2019-02-25 NOTE — Telephone Encounter (Signed)
Pt called again asking about the Prior Authorization for her medication. She needs this done soon.  She is frustrated because she said this is the 4th time she's had to call  Pt uses [facility] in [place]. The pharmacy has not been able to approve the request

## 2019-02-25 NOTE — Telephone Encounter (Signed)
Please give pt a call-- she's still needing a prior auth for her medication.   Would like someone to please return her call once this is complete.

## 2019-02-26 NOTE — Telephone Encounter (Signed)
PA denied as this medication is not covered by her insurance. Please advise   I have forwarded this message to Dr.Ross

## 2019-02-26 NOTE — Telephone Encounter (Signed)
I spoke with Samantha George.They have faxed PA requests to Dr.Ross at the [facility] office.  I have asked [facility] to fax to [facility] office and will submit to Cover My Meds

## 2019-03-01 NOTE — Telephone Encounter (Signed)
I will route again to Dr.Ross for dispo

## 2019-03-02 NOTE — Telephone Encounter (Signed)
Will call in Rx fro Metoprolol 50 mg bid One month and 6 refills

## 2019-03-04 ENCOUNTER — Telehealth: Payer: Self-pay | Admitting: Internal Medicine

## 2019-03-04 MED ORDER — METOPROLOL TARTRATE 50 MG PO TABS: 50.0000 mg | ORAL_TABLET | Freq: Two times a day (BID) | ORAL | 6 refills | Status: AC

## 2019-03-04 NOTE — Telephone Encounter (Signed)
E-scribed Metoprolol 50 mg BID to [facility]

## 2019-03-04 NOTE — Telephone Encounter (Signed)
Please call patient back regarding Prior Authorization for medications. / tg

## 2019-03-04 NOTE — Telephone Encounter (Signed)
Patients medication was changed to metoprolol and e-scribed to [facility], pt will pick up today

## 2019-04-23 ENCOUNTER — Other Ambulatory Visit: Payer: Self-pay | Admitting: Internal Medicine

## 2019-08-21 ENCOUNTER — Other Ambulatory Visit: Payer: Self-pay | Admitting: Internal Medicine

## 2019-09-20 ENCOUNTER — Other Ambulatory Visit: Payer: Self-pay | Admitting: Internal Medicine

## 2019-09-27 IMAGING — DX DG CHEST 2V
2 series · 2 of 2 positions shown · non-contrast
Comparison: None.

CLINICAL DATA: Chest pressure since this afternoon.

EXAM:
CHEST  2 VIEW

[chest pa]
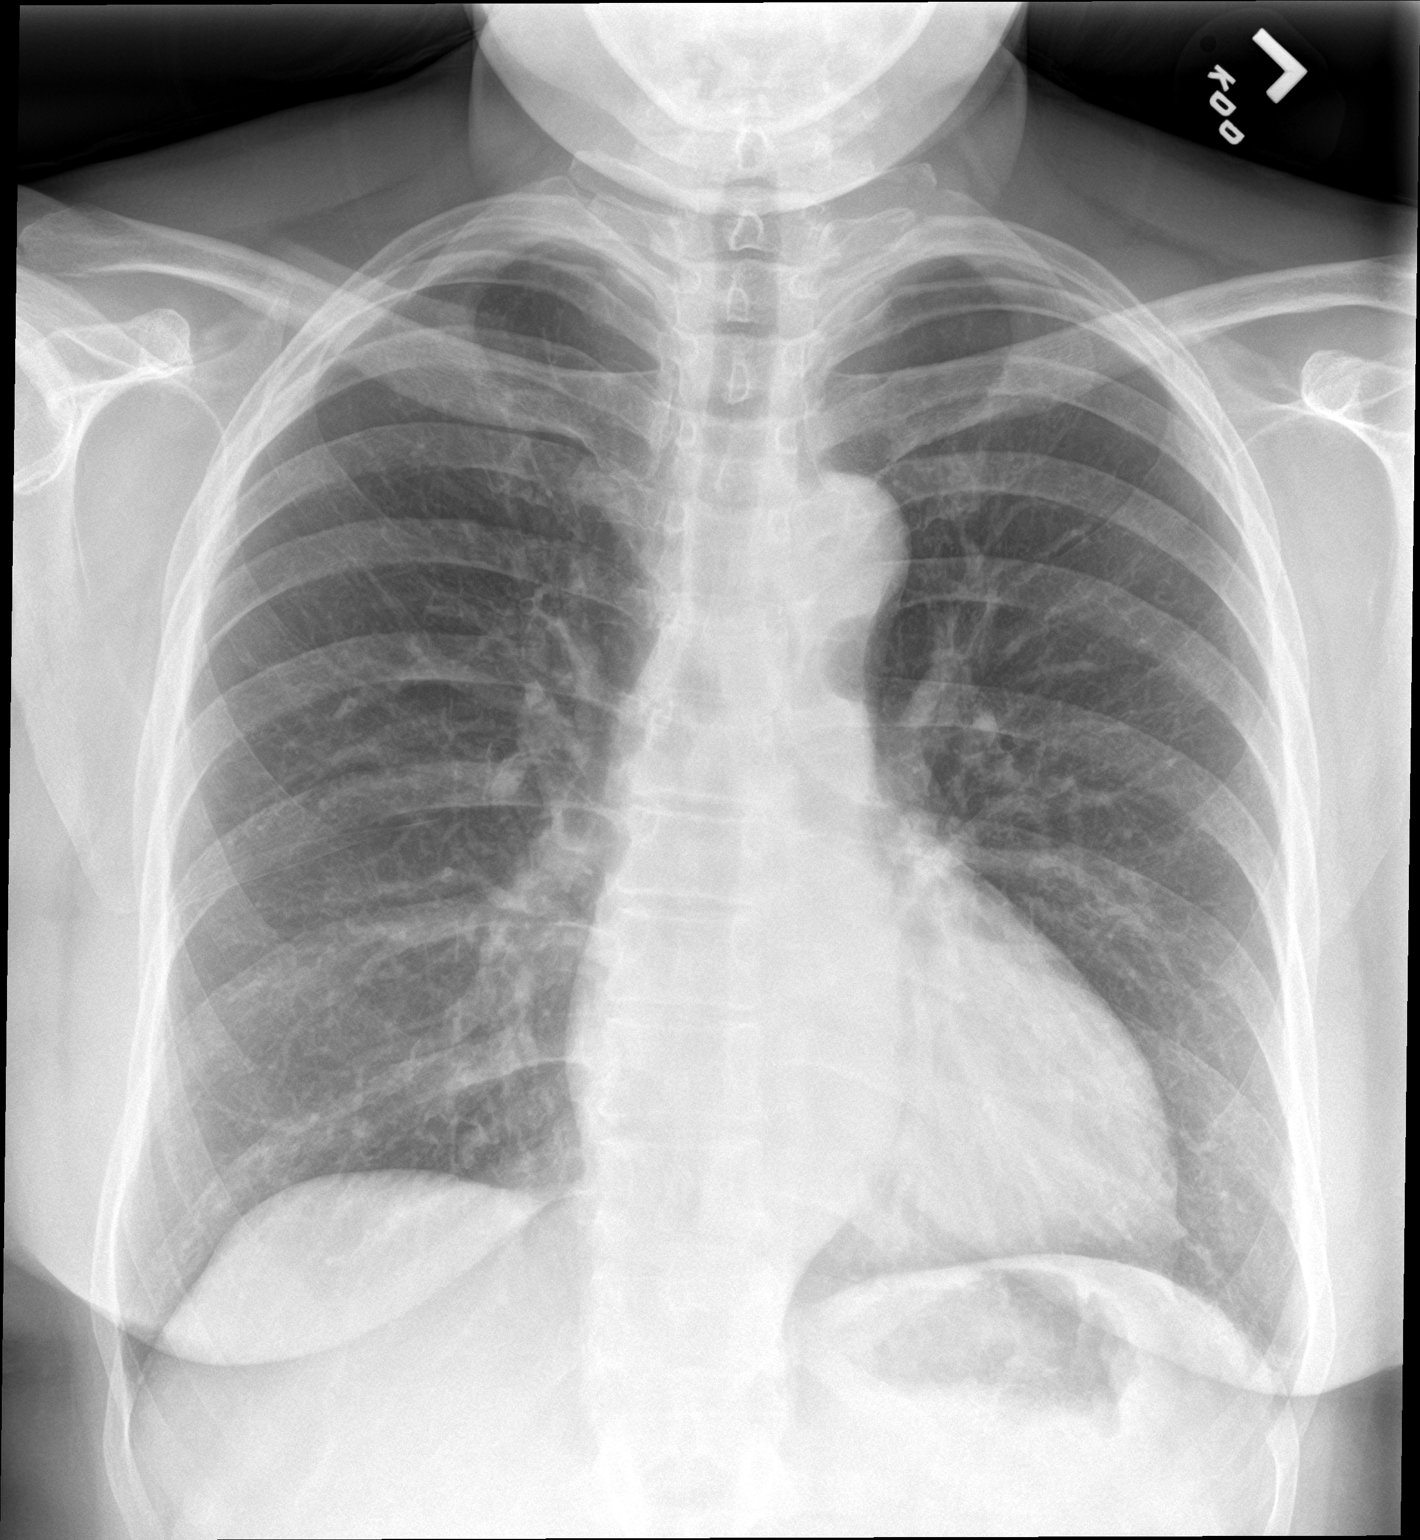

[chest lat]
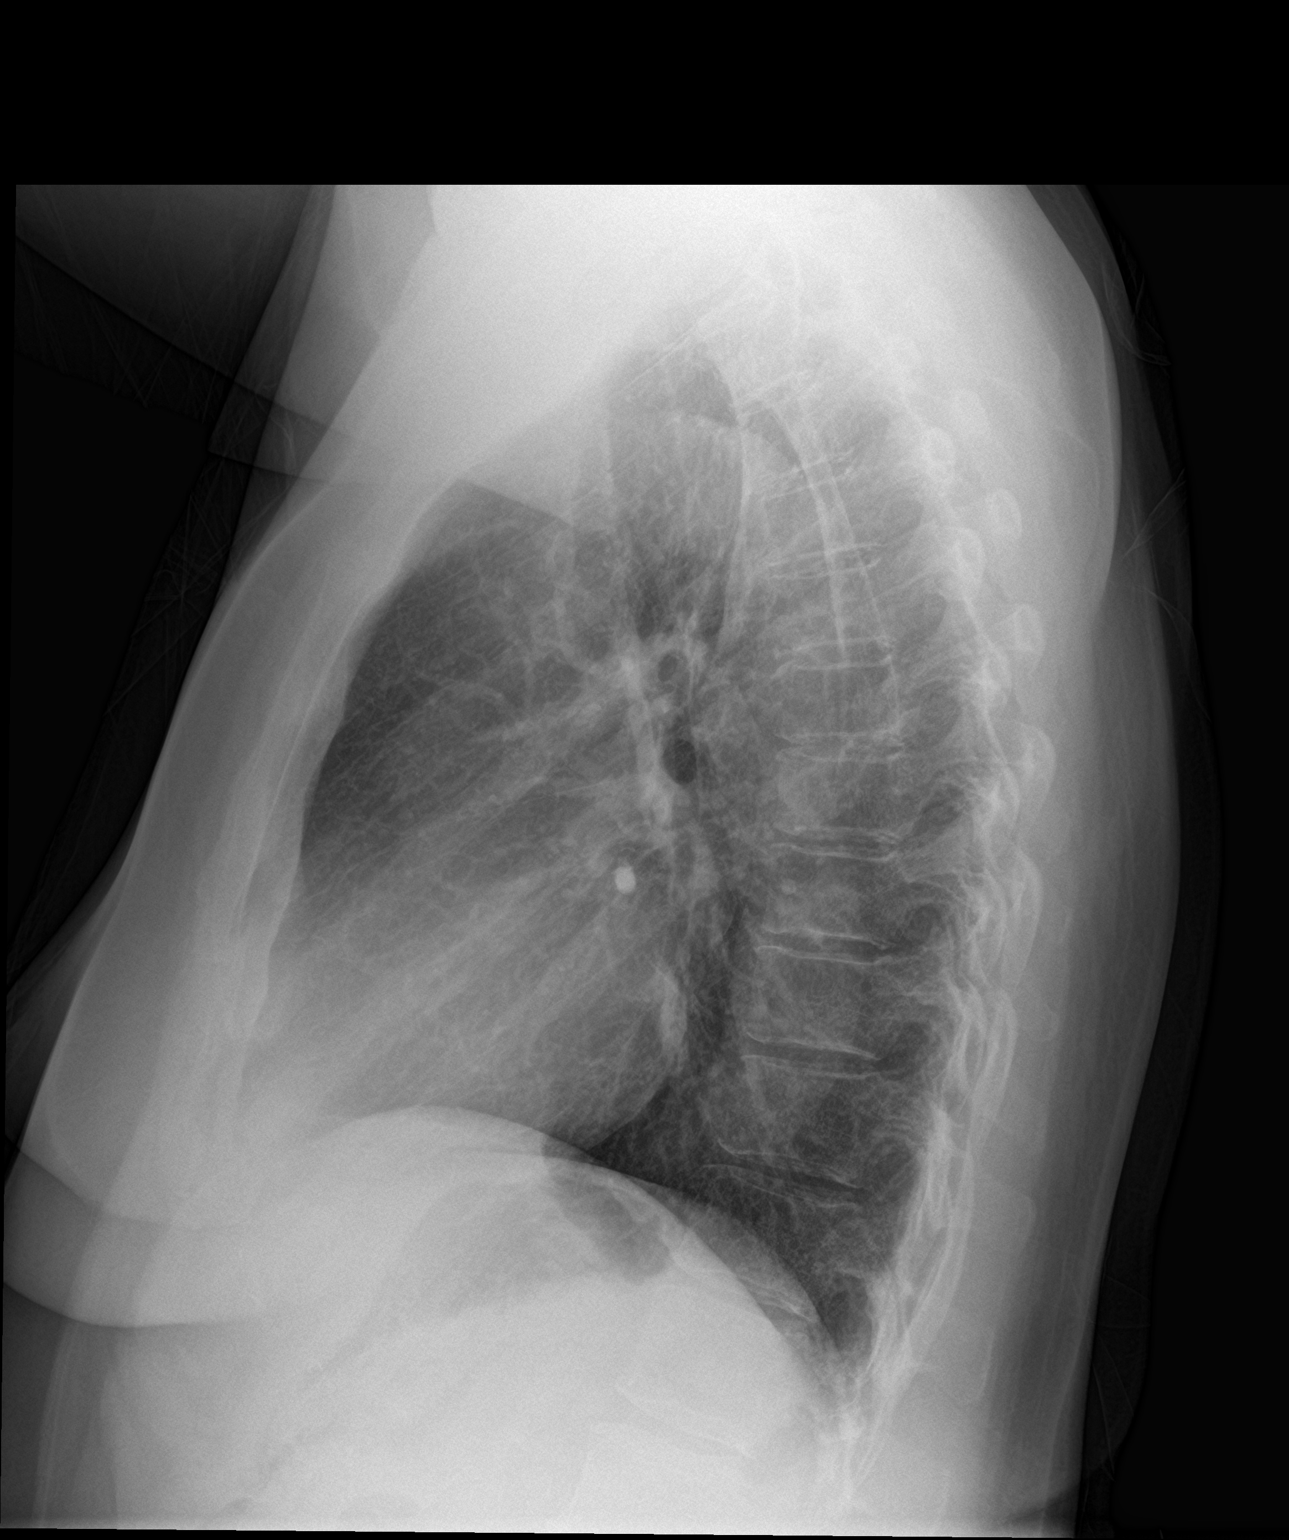

[2 of 2 positions shown; findings below may reference images not displayed]

FINDINGS: The heart size and mediastinal contours are within normal limits.
Tortuous thoracic aorta without aneurysm. Both lungs are clear. The
visualized skeletal structures are unremarkable.
IMPRESSION: No active cardiopulmonary disease. Mild uncoiling of the thoracic
aorta without aneurysm.

## 2019-12-23 ENCOUNTER — Other Ambulatory Visit: Payer: Self-pay | Admitting: Internal Medicine

## 2020-04-22 ENCOUNTER — Ambulatory Visit
Admission: EM | Admit: 2020-04-22 | Discharge: 2020-04-22 | Disposition: A | Discharge status: Home / Self Care | Payer: 59 | Attending: Emergency Medicine | Admitting: Emergency Medicine

## 2020-04-22 ENCOUNTER — Encounter: Payer: Self-pay | Admitting: Emergency Medicine

## 2020-04-22 ENCOUNTER — Other Ambulatory Visit: Payer: Self-pay

## 2020-04-22 DIAGNOSIS — Z1152 Encounter for screening for COVID-19: Secondary | ICD-10-CM | POA: Diagnosis not present

## 2020-04-22 NOTE — Discharge Instructions (Signed)

## 2020-04-22 NOTE — ED Triage Notes (Signed)
Pt here for covid testing for travel

## 2020-04-22 NOTE — ED Provider Notes (Signed)
[facility]    CSN: [id number] Arrival date & time: 04/22/20  0806      History   Chief Complaint No chief complaint on file.   HPI Samantha George is a 59 y.o. female.   who presents for COVID testing for traveling to [place].  Denies sick exposure to COVID, flu or strep.  Denies recent travel.  Denies aggravating or alleviating symptoms.  Denies previous COVID infection.   Denies fever, chills, fatigue, nasal congestion, rhinorrhea, sore throat, cough, SOB, wheezing, chest pain, nausea, vomiting, changes in bowel or bladder habits.    The history is provided by the patient. No language interpreter was used.    Past Medical History:  Diagnosis Date  . Allergy    seasonal  . Hypertension     Patient Active Problem List   Diagnosis Date Noted  . Esophageal reflux 08/30/2014  . Essential hypertension, benign 08/04/2013  . Palpitations 08/04/2013    Past Surgical History:  Procedure Laterality Date  . BREAST BIOPSY     left/benign  . BREAST ENHANCEMENT SURGERY    . BREAST SURGERY     implants removed  . CESAREAN SECTION    . COLONOSCOPY    . POLYPECTOMY      OB History   No obstetric history on file.      Home Medications    Prior to Admission medications   Medication Sig Start Date End Date Taking? Authorizing Provider  diltiazem (CARDIZEM CD) 120 MG 24 hr capsule TAKE (1) CAPSULE BY MOUTH EVERY DAY. 12/23/19   Fay Records, MD  metoprolol tartrate (LOPRESSOR) 50 MG tablet Take 1 tablet (50 mg total) by mouth 2 (two) times daily. 03/04/19 06/02/19  Fay Records, MD  nitroGLYCERIN (NITROSTAT) 0.4 MG SL tablet Place 1 tablet (0.4 mg total) under the tongue every 5 (five) minutes as needed for chest pain. [id number]   Delora Fuel, MD  Probiotic Product (PROBIOTIC DAILY PO) Take by mouth daily. Reported on 01/18/2016    [provider]    Family History Family History  Problem Relation Age of Onset  . Stomach cancer Maternal Grandfather   . Colon  cancer Neg Hx   . Colon polyps Neg Hx   . Rectal cancer Neg Hx     Social History Social History   Tobacco Use  . Smoking status: Never Smoker  . Smokeless tobacco: Never Used  Substance Use Topics  . Alcohol use: Yes    Comment: socially on weekends   . Drug use: No     Allergies   Patient has no known allergies.   Review of Systems Review of Systems  Constitutional: Negative.   HENT: Negative.   Respiratory: Negative.   Cardiovascular: Negative.   Gastrointestinal: Negative.   Neurological: Negative.      Physical Exam Triage Vital Signs ED Triage Vitals  Enc Vitals Group     BP      Pulse      Resp      Temp      Temp src      SpO2      Weight      Height      Head Circumference      Peak Flow      Pain Score      Pain Loc      Pain Edu?      Excl. in [place]?    No data found.  Updated Vital Signs LMP 05/06/2011  Visual Acuity Right Eye Distance:   Left Eye Distance:   Bilateral Distance:    Right Eye Near:   Left Eye Near:    Bilateral Near:     Physical Exam Vitals and nursing note reviewed.  Constitutional:      General: She is not in acute distress.    Appearance: Normal appearance. She is normal weight. She is not ill-appearing or toxic-appearing.  HENT:     Head: Normocephalic.     Right Ear: Tympanic membrane, ear canal and external ear normal. There is no impacted cerumen.     Left Ear: Tympanic membrane, ear canal and external ear normal. There is no impacted cerumen.     Nose: Nose normal. No congestion.     Mouth/Throat:     Mouth: Mucous membranes are moist.     Pharynx: Oropharynx is clear. No oropharyngeal exudate or posterior oropharyngeal erythema.  Cardiovascular:     Rate and Rhythm: Normal rate and regular rhythm.     Pulses: Normal pulses.     Heart sounds: Normal heart sounds. No murmur.  Pulmonary:     Effort: Pulmonary effort is normal. No respiratory distress.     Breath sounds: Normal breath sounds. No  wheezing or rhonchi.  Chest:     Chest wall: No tenderness.  Neurological:     General: No focal deficit present.     Mental Status: She is alert and oriented to person, place, and time.      UC Treatments / Results  Labs (all labs ordered are listed, but only abnormal results are displayed) Labs Reviewed  NOVEL CORONAVIRUS, NAA    EKG   Radiology No results found.  Procedures Procedures (including critical care time)  Medications Ordered in UC Medications - No data to display  Initial Impression / Assessment and Plan / UC Course  I have reviewed the triage vital signs and the nursing notes.  Pertinent labs & imaging results that were available during my care of the patient were reviewed by me and considered in my medical decision making (see chart for details).   Patient is stable at discharge.  She was advised to quarantine until COVID-19 test result came available.  Final Clinical Impressions(s) / UC Diagnoses   Final diagnoses:  Encounter for screening for COVID-19     Discharge Instructions     COVID testing ordered.  It will take between 2-7 days for test results.  Someone will contact you regarding abnormal results.    In the meantime: You should remain isolated in your home for 10 days from symptom onset AND greater than 24 hours after symptoms resolution (absence of fever without the use of fever-reducing medication and improvement in respiratory symptoms), whichever is longer Get plenty of rest and push fluids Use medications daily for symptom relief Use OTC medications like ibuprofen or tylenol as needed fever or pain Call or go to the ED if you have any new or worsening symptoms such as fever, worsening cough, shortness of breath, chest tightness, chest pain, turning blue, changes in mental status, etc...     ED Prescriptions    None     PDMP not reviewed this encounter.   Emerson Monte, FNP 04/22/20 0830

## 2020-04-23 LAB — SARS-COV-2, NAA 2 DAY TAT

## 2020-04-23 LAB — NOVEL CORONAVIRUS, NAA: SARS-CoV-2, NAA: NOT DETECTED

## 2022-06-30 ENCOUNTER — Encounter (HOSPITAL_COMMUNITY): Payer: Self-pay | Admitting: Occupational Therapy

## 2022-06-30 ENCOUNTER — Ambulatory Visit (HOSPITAL_COMMUNITY): Payer: 59 | Attending: Orthopedic Surgery | Admitting: Occupational Therapy

## 2022-06-30 DIAGNOSIS — R29898 Other symptoms and signs involving the musculoskeletal system: Secondary | ICD-10-CM | POA: Insufficient documentation

## 2022-06-30 DIAGNOSIS — M25512 Pain in left shoulder: Secondary | ICD-10-CM | POA: Diagnosis present

## 2022-06-30 NOTE — Therapy (Signed)
OUTPATIENT OCCUPATIONAL THERAPY ORTHO EVALUATION  Patient Name: OVEDA DADAMO MRN: [id number] DOB:January 04, 1961, 61 y.o., female Today's Date: 06/30/2022  PCP: Dr. Celene Squibb REFERRING PROVIDER: Dr. Edmonia Lynch   OT End of Session - 06/30/22 1524     Visit Number 1    Number of Visits 8    Date for OT Re-Evaluation 07/30/22    Authorization Type [employer], no copay    Authorization Time Period no visit limi    OT Start Time 1347    OT Stop Time 1417    OT Time Calculation (min) 30 min    Activity Tolerance Patient tolerated treatment well    Behavior During Therapy WFL for tasks assessed/performed             Past Medical History:  Diagnosis Date   Allergy    seasonal   Hypertension    Past Surgical History:  Procedure Laterality Date   BREAST BIOPSY     left/benign   BREAST ENHANCEMENT SURGERY     BREAST SURGERY     implants removed   CESAREAN SECTION     COLONOSCOPY     POLYPECTOMY     Patient Active Problem List   Diagnosis Date Noted   Esophageal reflux 08/30/2014   Essential hypertension, benign 08/04/2013   Palpitations 08/04/2013    ONSET DATE: several weeks  REFERRING DIAG: left shoulder impingement  THERAPY DIAG:  Acute pain of left shoulder  Other symptoms and signs involving the musculoskeletal system  Rationale for Evaluation and Treatment Rehabilitation  SUBJECTIVE:   SUBJECTIVE STATEMENT: S: When I reach a certain way it hurts really bad.  Pt accompanied by: self  PERTINENT HISTORY: Pt is a 61 y/o female presenting with left shoulder pain that has been present for several weeks. No known cause, x-ray negative for acute changes. Pt has pain primarily with certain movements.   PRECAUTIONS: None  WEIGHT BEARING RESTRICTIONS No  PAIN:  Are you having pain? No and only with reaching  FALLS: Has patient fallen in last 6 months? No  PLOF: Independent  PATIENT GOALS To have less pain in left arm/shoulder.   OBJECTIVE:   HAND  DOMINANCE: Right  ADLs: Overall ADLs: Pt reports she cannot sleep on her left side. Pt has difficulty with hooking bra, reaching back to pocket of pants, laying arm down flat for x-ray. Pt noted to have moderate fascial restrictions in left upper arm, anterior shoulder, and trapezius regions.    UPPER EXTREMITY ROM     Active ROM Left eval  Shoulder flexion 141  Shoulder abduction 124  Shoulder internal rotation 90  Shoulder external rotation 52  (Blank rows = not tested)   UPPER EXTREMITY MMT:     MMT Left eval  Shoulder flexion 4+/5  Shoulder abduction 4/5  Shoulder internal rotation 5/5  Shoulder external rotation 5/5  (Blank rows = not tested)  COGNITION: Overall cognitive status: Within functional limits for tasks assessed   PATIENT EDUCATION: Education details: shoulder stretches Person educated: Patient Education method: Explanation, Demonstration, and Handouts Education comprehension: verbalized understanding and returned demonstration   HOME EXERCISE PROGRAM: Eval: shoulder stretches  GOALS: Goals reviewed with patient? Yes  SHORT TERM GOALS: Target date: 07/28/2022    Pt will be provided with and educated on HEP to improve ability to use LUE during ADLs as non-dominant.   Goal status: INITIAL  2.  Pt will decrease pain in LUE to 3/10 or less to improve ability to sleep in  preferred position for 3+ hours without waking due to pain.  Goal status: INITIAL  3.  Pt will increase A/ROM in LUE by 10+ degrees to improve mobility required for dressing tasks such as hooking bra behind back.   Goal status: INITIAL  4.  Pt will decrease fascial restrictions in LUE to min amounts or less to improve functional mobility required for reaching overhead and behind back during ADLs.   Goal status: INITIAL    ASSESSMENT:  CLINICAL IMPRESSION: Patient is a 61 y.o. female who was seen today for occupational therapy evaluation for left shoulder pain. Pt reports  pain began several weeks ago with unknown origin. Does not have pain at rest, mostly with certain movements such as IR behind back and with laying on the left side.    PERFORMANCE DEFICITS in functional skills including ADLs, IADLs, ROM, strength, pain, fascial restrictions, muscle spasms, and UE functional use  IMPAIRMENTS are limiting patient from ADLs, IADLs, rest and sleep, and leisure.   COMORBIDITIES has no other co-morbidities that affects occupational performance. Patient will benefit from skilled OT to address above impairments and improve overall function.  MODIFICATION OR ASSISTANCE TO COMPLETE EVALUATION: No modification of tasks or assist necessary to complete an evaluation.  OT OCCUPATIONAL PROFILE AND HISTORY: Problem focused assessment: Including review of records relating to presenting problem.  CLINICAL DECISION MAKING: LOW - limited treatment options, no task modification necessary  REHAB POTENTIAL: Good  EVALUATION COMPLEXITY: Low      PLAN: OT FREQUENCY: 2x/week  OT DURATION: 4 weeks  PLANNED INTERVENTIONS: self care/ADL training, therapeutic exercise, therapeutic activity, manual therapy, passive range of motion, electrical stimulation, ultrasound, moist heat, cryotherapy, and patient/family education  CONSULTED AND AGREED WITH PLAN OF CARE: Patient  PLAN FOR NEXT SESSION: Follow up on HEP, initiate myofascial release, A/ROM, scapular strengthening with therabands   Guadelupe Sabin, OTR/L  [phone] 06/30/2022, 3:25 PM

## 2022-06-30 NOTE — Patient Instructions (Signed)

## 2022-07-05 ENCOUNTER — Encounter (HOSPITAL_COMMUNITY): Payer: Self-pay | Admitting: Occupational Therapy

## 2022-07-05 ENCOUNTER — Ambulatory Visit (HOSPITAL_COMMUNITY): Payer: 59 | Admitting: Occupational Therapy

## 2022-07-05 DIAGNOSIS — R29898 Other symptoms and signs involving the musculoskeletal system: Secondary | ICD-10-CM

## 2022-07-05 DIAGNOSIS — M25512 Pain in left shoulder: Secondary | ICD-10-CM | POA: Diagnosis not present

## 2022-07-05 NOTE — Therapy (Signed)
OUTPATIENT OCCUPATIONAL THERAPY ORTHO TREATMENT  Patient Name: ANAISSA MACFADDEN MRN: [id number] DOB:Apr 19, 1961, 61 y.o., female Today's Date: 07/05/2022  PCP: Dr. Celene Squibb REFERRING PROVIDER: Dr. Edmonia Lynch   OT End of Session - 07/05/22 1516     Visit Number 2    Number of Visits 8    Date for OT Re-Evaluation 07/30/22    Authorization Type [employer], no copay    Authorization Time Period no visit limi    OT Start Time 1430    OT Stop Time 1510    OT Time Calculation (min) 40 min    Activity Tolerance Patient tolerated treatment well    Behavior During Therapy WFL for tasks assessed/performed              Past Medical History:  Diagnosis Date   Allergy    seasonal   Hypertension    Past Surgical History:  Procedure Laterality Date   BREAST BIOPSY     left/benign   BREAST ENHANCEMENT SURGERY     BREAST SURGERY     implants removed   CESAREAN SECTION     COLONOSCOPY     POLYPECTOMY     Patient Active Problem List   Diagnosis Date Noted   Esophageal reflux 08/30/2014   Essential hypertension, benign 08/04/2013   Palpitations 08/04/2013    ONSET DATE: several weeks  REFERRING DIAG: left shoulder impingement  THERAPY DIAG:  Acute pain of left shoulder  Other symptoms and signs involving the musculoskeletal system  Rationale for Evaluation and Treatment Rehabilitation  SUBJECTIVE:   SUBJECTIVE STATEMENT: S: I think the stretches are helping.  Pt accompanied by: self  PERTINENT HISTORY: Pt is a 61 y/o female presenting with left shoulder pain that has been present for several weeks. No known cause, x-ray negative for acute changes. Pt has pain primarily with certain movements.   PRECAUTIONS: None  WEIGHT BEARING RESTRICTIONS No  PAIN:  Are you having pain? No and only with reaching  FALLS: Has patient fallen in last 6 months? No  PLOF: Independent  PATIENT GOALS To have less pain in left arm/shoulder.   OBJECTIVE:    UPPER EXTREMITY ROM      Active ROM Left eval  Shoulder flexion 141  Shoulder abduction 124  Shoulder internal rotation 90  Shoulder external rotation 52  (Blank rows = not tested)   UPPER EXTREMITY MMT:     MMT Left eval  Shoulder flexion 4+/5  Shoulder abduction 4/5  Shoulder internal rotation 5/5  Shoulder external rotation 5/5  (Blank rows = not tested)  COGNITION: Overall cognitive status: Within functional limits for tasks assessed     TODAY'S TREATMENT: 07/05/22 -myofascial release of trapezius, shoulder girdle, and scapula to decrease pain and fascial restrictions and increase joint ROM -PROM Shoulder: flexion, abduction, external/internal rotation, and horizontal abduction, x5 reps -A/ROM supine: chest press with scapular protraction, flexion, horizontal abduction, external/internal rotation, abduction, x10 reps -A/ROM standing: chest press with scapular protraction, flexion, horizontal abduction, external/internal rotation, abduction, X to V arms, x10 reps -Proximal shoulder stabilization: paddles, crisscross, circles in both directions, x10 reps -Ball pass behind back for IR to simulate dressing, x10 reps - pt reports tightness with trying to reach behind her back -Scapular strengthening: Red band- row, extension, retraction, x10 reps -Large therapy ball roll, standing facing the wall, rolling into flexion, x10 reps    PATIENT EDUCATION: Education details: discussed HEP Person educated: Patient Education method: Explanation, Demonstration, and Handouts Education comprehension: verbalized understanding  and returned demonstration   HOME EXERCISE PROGRAM: Eval: shoulder stretches  GOALS: Goals reviewed with patient? Yes  SHORT TERM GOALS: Target date: 08/02/2022    Pt will be provided with and educated on HEP to improve ability to use LUE during ADLs as non-dominant.   Goal status: IN PROGRESS  2.  Pt will decrease pain in LUE to 3/10 or less to improve ability to sleep in  preferred position for 3+ hours without waking due to pain.  Goal status: IN PROGRESS  3.  Pt will increase A/ROM in LUE by 10+ degrees to improve mobility required for dressing tasks such as hooking bra behind back.   Goal status: IN PROGRESS  4.  Pt will decrease fascial restrictions in LUE to min amounts or less to improve functional mobility required for reaching overhead and behind back during ADLs.   Goal status: IN PROGRESS    ASSESSMENT:  CLINICAL IMPRESSION: Pt reports her HEP is going well and she feels like it is helping. Initiated myofascial release to address pain and fascial restrictions, passive stretching, and A/ROM to LUE. Pt achieving ROM approximately 75-90% with both passive and active tasks. Completing scapular theraband, OT noting scapular asymmetry with retraction, also noted mild winging with standing er. Pt reporting no pain, occasional pull or tightness. Verbal cuing for form and technique during tasks.    PLAN: OT FREQUENCY: 2x/week  OT DURATION: 4 weeks  PLANNED INTERVENTIONS: self care/ADL training, therapeutic exercise, therapeutic activity, manual therapy, passive range of motion, electrical stimulation, ultrasound, moist heat, cryotherapy, and patient/family education  CONSULTED AND AGREED WITH PLAN OF CARE: Patient  PLAN FOR NEXT SESSION: continue with myofascial release, continue A/ROM and scapular strengthening with therabands- add to HEP. Trial overhead lacing, ball on wall   Guadelupe Sabin, OTR/L  [phone] 07/05/2022, 3:17 PM

## 2022-07-07 ENCOUNTER — Encounter (HOSPITAL_COMMUNITY): Payer: Self-pay

## 2022-07-07 ENCOUNTER — Ambulatory Visit (HOSPITAL_COMMUNITY): Payer: 59

## 2022-07-07 DIAGNOSIS — M25512 Pain in left shoulder: Secondary | ICD-10-CM | POA: Diagnosis not present

## 2022-07-07 DIAGNOSIS — R29898 Other symptoms and signs involving the musculoskeletal system: Secondary | ICD-10-CM

## 2022-07-07 NOTE — Therapy (Signed)
OUTPATIENT OCCUPATIONAL THERAPY ORTHO TREATMENT  Patient Name: Samantha George MRN: [id number] DOB:06-13-1961, 61 y.o., female Today's Date: 07/07/2022  PCP: Dr. Celene Squibb REFERRING PROVIDER: Dr. Edmonia Lynch   OT End of Session - 07/07/22 1105     Visit Number 3    Number of Visits 8    Date for OT Re-Evaluation 07/30/22    Authorization Type [employer], no copay    Authorization Time Period no visit limi    OT Start Time [phone]    OT Stop Time 1031    OT Time Calculation (min) 44 min    Activity Tolerance Patient tolerated treatment well    Behavior During Therapy WFL for tasks assessed/performed               Past Medical History:  Diagnosis Date   Allergy    seasonal   Hypertension    Past Surgical History:  Procedure Laterality Date   BREAST BIOPSY     left/benign   BREAST ENHANCEMENT SURGERY     BREAST SURGERY     implants removed   CESAREAN SECTION     COLONOSCOPY     POLYPECTOMY     Patient Active Problem List   Diagnosis Date Noted   Esophageal reflux 08/30/2014   Essential hypertension, benign 08/04/2013   Palpitations 08/04/2013    ONSET DATE: several weeks  REFERRING DIAG: left shoulder impingement  THERAPY DIAG:  Acute pain of left shoulder  Other symptoms and signs involving the musculoskeletal system  Rationale for Evaluation and Treatment Rehabilitation  SUBJECTIVE:   SUBJECTIVE STATEMENT: S: "It still hurts with that one movement, but I already feel better than I did." Pt accompanied by: self  PERTINENT HISTORY: Pt is a 61 y/o female presenting with left shoulder pain that has been present for several weeks. No known cause, x-ray negative for acute changes. Pt has pain primarily with certain movements.   PRECAUTIONS: None  WEIGHT BEARING RESTRICTIONS No  PAIN:  Are you having pain? No and only with reaching  FALLS: Has patient fallen in last 6 months? No  PLOF: Independent  PATIENT GOALS To have less pain in left  arm/shoulder.   OBJECTIVE:    UPPER EXTREMITY ROM     Active ROM Left eval  Shoulder flexion 141  Shoulder abduction 124  Shoulder internal rotation 90  Shoulder external rotation 52  (Blank rows = not tested)   UPPER EXTREMITY MMT:     MMT Left eval  Shoulder flexion 4+/5  Shoulder abduction 4/5  Shoulder internal rotation 5/5  Shoulder external rotation 5/5  (Blank rows = not tested)    TODAY'S TREATMENT:  07/07/22  -Myofascial release of trapezius, shoulder girdle, and scapula to decrease pain and fascial restrictions and increase joint ROM -PROM Shoulder: flexion, abduction, external/internal rotation, and horizontal abduction, x5 reps -Wall Slides, flexion, 1x5 with 10 second hold  -Yellow theraband chest pulls, 1x10, 2 second hold at end  -Shoulder Isometrics, flexion, extension, abduction, IR/er, 5x5"   -Standing Y exercises to 90 degrees, 1x10, 2# -Proximal shoulder stabilization: paddles, rest break, crisscross, rest break, circles in both directions, x 30" each direction -Scapular strengthening: Red band- row, extension, retraction, x10 reps  07/05/22 -myofascial release of trapezius, shoulder girdle, and scapula to decrease pain and fascial restrictions and increase joint ROM -PROM Shoulder: flexion, abduction, external/internal rotation, and horizontal abduction, x5 reps -A/ROM supine: chest press with scapular protraction, flexion, horizontal abduction, external/internal rotation, abduction, x10 reps -A/ROM standing: chest  press with scapular protraction, flexion, horizontal abduction, external/internal rotation, abduction, X to V arms, x10 reps -Proximal shoulder stabilization: paddles, crisscross, circles in both directions, x10 reps -Ball pass behind back for IR to simulate dressing, x10 reps - pt reports tightness with trying to reach behind her back -Scapular strengthening: Red band- row, extension, retraction, x10 reps -Large therapy ball roll, standing  facing the wall, rolling into flexion, x10 reps    PATIENT EDUCATION: Education details: Shoulder Isometrics, red theraband scapular strengthening Person educated: Patient Education method: Explanation, Demonstration, and Handouts Education comprehension: verbalized understanding and returned demonstration   HOME EXERCISE PROGRAM: Eval: shoulder stretches 8/10: Shoulder Isometrics, red theraband scapular strengthening  GOALS: Goals reviewed with patient? Yes  SHORT TERM GOALS: Target date: 08/04/2022    Pt will be provided with and educated on HEP to improve ability to use LUE during ADLs as non-dominant.   Goal status: IN PROGRESS  2.  Pt will decrease pain in LUE to 3/10 or less to improve ability to sleep in preferred position for 3+ hours without waking due to pain.  Goal status: IN PROGRESS  3.  Pt will increase A/ROM in LUE by 10+ degrees to improve mobility required for dressing tasks such as hooking bra behind back.   Goal status: IN PROGRESS  4.  Pt will decrease fascial restrictions in LUE to min amounts or less to improve functional mobility required for reaching overhead and behind back during ADLs.   Goal status: IN PROGRESS    ASSESSMENT:  CLINICAL IMPRESSION: Pt continues to reports her HEP is going well and she is benefiting from the stretches, with pain only occurring with a certain movement. Demonstration and moderate verbal cues for new exercises today, pt reporting no increased pain. She continues with end range limitations with passive and active stretching but today achieving about 80% flexion and abduction. Completed wall slides with stretch to encourage prolonged stretching in forward flexion.    PLAN: OT FREQUENCY: 2x/week  OT DURATION: 4 weeks  PLANNED INTERVENTIONS: self care/ADL training, therapeutic exercise, therapeutic activity, manual therapy, passive range of motion, electrical stimulation, ultrasound, moist heat, cryotherapy, and  patient/family education  CONSULTED AND AGREED WITH PLAN OF CARE: Patient  PLAN FOR NEXT SESSION: continue with myofascial release, continue A/ROM and scapular strengthening with therabands. Trial overhead lacing, ball on wall. [place], OTR/L [phone]  07/07/2022, 11:14 AM

## 2022-07-07 NOTE — Patient Instructions (Addendum)
  Complete the following 1-2 a day. Hold for 5 seconds. Complete 5 sets for each.   1) SHOULDER - ISOMETRIC FLEXION  Gently push your fist forward into a wall with your elbow bent.    2) SHOULDER - ISOMETRIC EXTENSION  Gently push your a bent elbow back into a wall.    3) SHOULDER - ISOMETRIC INTERNAL ROTATION   Gently press your hand into a wall using the palm side of your hand.  Maintain a bent elbow the entire time.         5) SHOULDER - ISOMETRIC ABDUCTION  Gently push your elbow out to the side into a wall with your elbow bent.            1) (Clinic) Extension / Flexion (Assist)   Face anchor, pull arms back, keeping elbow straight, and squeze shoulder blades together. Repeat 10-15 times. 1-3 times/day.   Copyright  [employer]. All rights reserved.   2) (Home) Retraction: Row - Bilateral (Anchor)   Facing anchor, arms reaching forward, pull hands toward stomach, keeping elbows bent and at your sides and pinching shoulder blades together. Repeat 10-15 times. 1-3 times/day.   Copyright  [employer]. All rights reserved.

## 2022-07-11 ENCOUNTER — Encounter (HOSPITAL_COMMUNITY): Payer: 59

## 2022-07-13 ENCOUNTER — Encounter (HOSPITAL_COMMUNITY): Payer: Self-pay

## 2022-07-13 ENCOUNTER — Ambulatory Visit (HOSPITAL_COMMUNITY): Payer: 59

## 2022-07-13 DIAGNOSIS — M25512 Pain in left shoulder: Secondary | ICD-10-CM | POA: Diagnosis not present

## 2022-07-13 DIAGNOSIS — R29898 Other symptoms and signs involving the musculoskeletal system: Secondary | ICD-10-CM

## 2022-07-13 NOTE — Therapy (Signed)
OUTPATIENT OCCUPATIONAL THERAPY ORTHO TREATMENT  Patient Name: Samantha George MRN: [id number] DOB:1961/06/02, 61 y.o., female Today's Date: 07/13/2022  PCP: Dr. Celene Squibb REFERRING PROVIDER: Dr. Edmonia Lynch   OT End of Session - 07/13/22 0818     Visit Number 4    Number of Visits 8    Date for OT Re-Evaluation 07/30/22    Authorization Type [employer], no copay    Authorization Time Period no visit limi    OT Start Time 0820    OT Stop Time 0855    OT Time Calculation (min) 35 min    Activity Tolerance Patient tolerated treatment well    Behavior During Therapy WFL for tasks assessed/performed               Past Medical History:  Diagnosis Date   Allergy    seasonal   Hypertension    Past Surgical History:  Procedure Laterality Date   BREAST BIOPSY     left/benign   BREAST ENHANCEMENT SURGERY     BREAST SURGERY     implants removed   CESAREAN SECTION     COLONOSCOPY     POLYPECTOMY     Patient Active Problem List   Diagnosis Date Noted   Esophageal reflux 08/30/2014   Essential hypertension, benign 08/04/2013   Palpitations 08/04/2013    ONSET DATE: several weeks  REFERRING DIAG: left shoulder impingement  THERAPY DIAG:  Acute pain of left shoulder  Other symptoms and signs involving the musculoskeletal system  Rationale for Evaluation and Treatment Rehabilitation  SUBJECTIVE:   SUBJECTIVE STATEMENT: S: "It's been about the same. I did the exercises I could. It's not getting any worse. Only when I move that weird way. I can sleep for a little bit on my left side now, I couldn't do that before"  Pt accompanied by: self  PERTINENT HISTORY: Pt is a 61 y/o female presenting with left shoulder pain that has been present for several weeks. No known cause, x-ray negative for acute changes. Pt has pain primarily with certain movements.   PRECAUTIONS: None  WEIGHT BEARING RESTRICTIONS No  PAIN:  Are you having pain? No and only with  reaching   OBJECTIVE:    UPPER EXTREMITY ROM     Active ROM Left eval  Shoulder flexion 141  Shoulder abduction 124  Shoulder internal rotation 90  Shoulder external rotation 52  (Blank rows = not tested)   UPPER EXTREMITY MMT:     MMT Left eval  Shoulder flexion 4+/5  Shoulder abduction 4/5  Shoulder internal rotation 5/5  Shoulder external rotation 5/5  (Blank rows = not tested)    TODAY'S TREATMENT:  07/13/22 -Moist heat applied to upper trapezius to reduce fascial restrictions and pain -Scapular strengthening: 1x10 prone extension with palms down, 3 second hold -Sidelying IR/er, 2#  -Sidelying abduction, 2# to 90 degrees -Ktape: two I strips applied. First anchored at anterior shoulder, 75% stretch to posterior medial shoulder blade. Second anchored above mid clavicle, 50% stretch to medial shoulder blade. -Red theraband chest pulls, 1x10, 2 second hold at end  -Wall angels, 1x10 -Scapular strengthening, 1x10 blue theraband row and extension   07/07/22  -Myofascial release of trapezius, shoulder girdle, and scapula to decrease pain and fascial restrictions and increase joint ROM -PROM Shoulder: flexion, abduction, external/internal rotation, and horizontal abduction, x5 reps -Wall Slides, flexion, 1x5 with 10 second hold  -Yellow theraband chest pulls, 1x10, 2 second hold at end  -Shoulder Isometrics, flexion,  extension, abduction, IR/er, 5x5"   -Standing Y exercises to 90 degrees, 1x10, 2# -Proximal shoulder stabilization: paddles, rest break, crisscross, rest break, circles in both directions, x 30" each direction -Scapular strengthening: Red band- row, extension, retraction, x10 reps  07/05/22 -myofascial release of trapezius, shoulder girdle, and scapula to decrease pain and fascial restrictions and increase joint ROM -PROM Shoulder: flexion, abduction, external/internal rotation, and horizontal abduction, x5 reps -A/ROM supine: chest press with scapular  protraction, flexion, horizontal abduction, external/internal rotation, abduction, x10 reps -A/ROM standing: chest press with scapular protraction, flexion, horizontal abduction, external/internal rotation, abduction, X to V arms, x10 reps -Proximal shoulder stabilization: paddles, crisscross, circles in both directions, x10 reps -Ball pass behind back for IR to simulate dressing, x10 reps - pt reports tightness with trying to reach behind her back -Scapular strengthening: Red band- row, extension, retraction, x10 reps -Large therapy ball roll, standing facing the wall, rolling into flexion, x10 reps    PATIENT EDUCATION: Education details: Review HEP Person educated: Patient Education method: Consulting civil engineer, Media planner, and Handouts Education comprehension: verbalized understanding and returned demonstration   HOME EXERCISE PROGRAM: Eval: shoulder stretches 8/10: Shoulder Isometrics, red theraband scapular strengthening  GOALS: Goals reviewed with patient? Yes  SHORT TERM GOALS: Target date: 08/10/2022    Pt will be provided with and educated on HEP to improve ability to use LUE during ADLs as non-dominant.   Goal status: IN PROGRESS  2.  Pt will decrease pain in LUE to 3/10 or less to improve ability to sleep in preferred position for 3+ hours without waking due to pain.  Goal status: IN PROGRESS  3.  Pt will increase A/ROM in LUE by 10+ degrees to improve mobility required for dressing tasks such as hooking bra behind back.   Goal status: IN PROGRESS  4.  Pt will decrease fascial restrictions in LUE to min amounts or less to improve functional mobility required for reaching overhead and behind back during ADLs.   Goal status: IN PROGRESS    ASSESSMENT:  CLINICAL IMPRESSION: Pt reports continued pain with certain movements including hooking her bra, walking her dog when it pulls in a certain direction, and reaching out the window of a drive through. Noted limited er in  abduction with wall angels. Able to complete scapular strengthening with resistance today. Ktape applied to help with posture and alignment. Pt only reporting slight discomfort with external rotation, therapist cuing to only complete motion to point of increased pain. Moderate verbal cues for positioning with exercises throughout session.     PLAN: OT FREQUENCY: 2x/week  OT DURATION: 4 weeks  PLANNED INTERVENTIONS: self care/ADL training, therapeutic exercise, therapeutic activity, manual therapy, passive range of motion, electrical stimulation, ultrasound, moist heat, cryotherapy, and patient/family education  CONSULTED AND AGREED WITH PLAN OF CARE: Patient  PLAN FOR NEXT SESSION: continue with myofascial release, continue A/ROM and scapular strengthening with therabands. Trial overhead lacing, ball on wall. KTAPE. Resisted IR/er, manual work to subscapularis/axillary region.    Flonnie Hailstone, [place], OTR/L [phone]  07/13/2022, 11:00 AM

## 2022-07-19 ENCOUNTER — Encounter (HOSPITAL_COMMUNITY): Payer: Self-pay

## 2022-07-19 ENCOUNTER — Ambulatory Visit (HOSPITAL_COMMUNITY): Payer: 59

## 2022-07-19 DIAGNOSIS — R29898 Other symptoms and signs involving the musculoskeletal system: Secondary | ICD-10-CM

## 2022-07-19 DIAGNOSIS — M25512 Pain in left shoulder: Secondary | ICD-10-CM | POA: Diagnosis not present

## 2022-07-19 NOTE — Therapy (Signed)
OUTPATIENT OCCUPATIONAL THERAPY ORTHO TREATMENT  Patient Name: Samantha George MRN: [id number] DOB:Jul 12, 1961, 61 y.o., female Today's Date: 07/19/2022  PCP: Dr. Celene Squibb REFERRING PROVIDER: Dr. Edmonia Lynch   OT End of Session - 07/19/22 0902     Visit Number 5    Number of Visits 8    Date for OT Re-Evaluation 07/30/22    Authorization Type [employer], no copay    Authorization Time Period no visit limi    OT Start Time 0900    OT Stop Time 0944    OT Time Calculation (min) 44 min    Activity Tolerance Patient tolerated treatment well    Behavior During Therapy WFL for tasks assessed/performed               Past Medical History:  Diagnosis Date   Allergy    seasonal   Hypertension    Past Surgical History:  Procedure Laterality Date   BREAST BIOPSY     left/benign   BREAST ENHANCEMENT SURGERY     BREAST SURGERY     implants removed   CESAREAN SECTION     COLONOSCOPY     POLYPECTOMY     Patient Active Problem List   Diagnosis Date Noted   Esophageal reflux 08/30/2014   Essential hypertension, benign 08/04/2013   Palpitations 08/04/2013    ONSET DATE: several weeks  REFERRING DIAG: left shoulder impingement  THERAPY DIAG:  Acute pain of left shoulder  Other symptoms and signs involving the musculoskeletal system  Rationale for Evaluation and Treatment Rehabilitation  SUBJECTIVE:   SUBJECTIVE STATEMENT: S: "That tape was amazing. I didn't have as much pain with certain movements"  Pt accompanied by: self  PERTINENT HISTORY: Pt is a 61 y/o female presenting with left shoulder pain that has been present for several weeks. No known cause, x-ray negative for acute changes. Pt has pain primarily with certain movements.   PRECAUTIONS: None  WEIGHT BEARING RESTRICTIONS No  PAIN:  Are you having pain? No and only with reaching   OBJECTIVE:    UPPER EXTREMITY ROM     Active ROM Left eval  Shoulder flexion 141  Shoulder abduction 124   Shoulder internal rotation 90  Shoulder external rotation 52  (Blank rows = not tested)   UPPER EXTREMITY MMT:     MMT Left eval  Shoulder flexion 4+/5  Shoulder abduction 4/5  Shoulder internal rotation 5/5  Shoulder external rotation 5/5  (Blank rows = not tested)    TODAY'S TREATMENT:  07/19/22 -Manual Therapy: Myofascial release, soft tissue mobilization, and trigger point of trapezius, shoulder girdle, subscapular area, and scapula to decrease pain and fascial restrictions and increase joint ROM -Scapular strengthening: 1x12 prone extension with palms down, 3 second hold -Sidelying IR/er, 2#, 1x12  -Sidelying abduction, 2# to 90 degrees, 1x12 -Ktape: two I strips applied. First anchored at anterior shoulder, 75% stretch to posterior medial shoulder blade. Second anchored above mid clavicle, 50% stretch to medial shoulder blade. -UBE, level 1, 2 minutes forward, 1 minute reverse, 4.0 speed -Theraband Strengthening  -Row, extension, 1x12, blue theraband  -abducted ir/er, 1x10  -chest pull apart, eyllow, 1x12 -Proximal shoulder strengthening: paddles, crisscross, circles in both directions, x 30" each direction   07/13/22 -Moist heat applied to upper trapezius to reduce fascial restrictions and pain -Scapular strengthening: 1x10 prone extension with palms down, 3 second hold -Sidelying IR/er, 2#  -Sidelying abduction, 2# to 90 degrees -Ktape: two I strips applied. First anchored at anterior  shoulder, 75% stretch to posterior medial shoulder blade. Second anchored above mid clavicle, 50% stretch to medial shoulder blade. -Red theraband chest pulls, 1x10, 2 second hold at end  -Wall angels, 1x10 -Scapular strengthening, 1x10 blue theraband row and extension   07/07/22  -Myofascial release of trapezius, shoulder girdle, and scapula to decrease pain and fascial restrictions and increase joint ROM -PROM Shoulder: flexion, abduction, external/internal rotation, and horizontal  abduction, x5 reps -Wall Slides, flexion, 1x5 with 10 second hold  -Yellow theraband chest pulls, 1x10, 2 second hold at end  -Shoulder Isometrics, flexion, extension, abduction, IR/er, 5x5"   -Standing Y exercises to 90 degrees, 1x10, 2# -Proximal shoulder stabilization: paddles, rest break, crisscross, rest break, circles in both directions, x 30" each direction -Scapular strengthening: Red band- row, extension, retraction, x10 reps      PATIENT EDUCATION: Education details: Review HEP Person educated: Patient Education method: Consulting civil engineer, Media planner, and Handouts Education comprehension: verbalized understanding and returned demonstration   HOME EXERCISE PROGRAM: Eval: shoulder stretches 8/10: Shoulder Isometrics, red theraband scapular strengthening  GOALS: Goals reviewed with patient? Yes  SHORT TERM GOALS: Target date: 08/16/2022    Pt will be provided with and educated on HEP to improve ability to use LUE during ADLs as non-dominant.   Goal status: IN PROGRESS  2.  Pt will decrease pain in LUE to 3/10 or less to improve ability to sleep in preferred position for 3+ hours without waking due to pain.  Goal status: IN PROGRESS  3.  Pt will increase A/ROM in LUE by 10+ degrees to improve mobility required for dressing tasks such as hooking bra behind back.   Goal status: IN PROGRESS  4.  Pt will decrease fascial restrictions in LUE to min amounts or less to improve functional mobility required for reaching overhead and behind back during ADLs.   Goal status: IN PROGRESS    ASSESSMENT:  CLINICAL IMPRESSION: Pt reports significant overall relief from ktape and less pain with certain movements. Discussed the importance of exercises and stretching with ktape as a support. Some decreased restrictions noted today but most occurring in subscapular/axillary region. Emphasis today on scapular strengthening and stretching, therapist providing moderate tactile cues for  positioning, especially with abducted IR/er exercises. Pt reporting some pain with er but otherwise pain free with exercises today.      PLAN: OT FREQUENCY: 2x/week  OT DURATION: 4 weeks  PLANNED INTERVENTIONS: self care/ADL training, therapeutic exercise, therapeutic activity, manual therapy, passive range of motion, electrical stimulation, ultrasound, moist heat, cryotherapy, and patient/family education  CONSULTED AND AGREED WITH PLAN OF CARE: Patient  PLAN FOR NEXT SESSION: continue with myofascial release, continue A/ROM and scapular strengthening with therabands. Trial overhead lacing, ball on wall. KTAPE. Resisted IR/er, manual work to subscapularis/axillary region.    Flonnie Hailstone, [place], OTR/L [phone]  07/19/2022, 10:44 AM

## 2022-07-21 ENCOUNTER — Encounter (HOSPITAL_COMMUNITY): Payer: Self-pay

## 2022-07-21 ENCOUNTER — Ambulatory Visit (HOSPITAL_COMMUNITY): Payer: 59

## 2022-07-21 DIAGNOSIS — M25512 Pain in left shoulder: Secondary | ICD-10-CM

## 2022-07-21 DIAGNOSIS — R29898 Other symptoms and signs involving the musculoskeletal system: Secondary | ICD-10-CM

## 2022-07-21 NOTE — Therapy (Signed)
OUTPATIENT OCCUPATIONAL THERAPY ORTHO TREATMENT  Patient Name: Samantha George MRN: [id number] DOB:11-09-1961, 61 y.o., female Today's Date: 07/21/2022  PCP: Dr. Celene Squibb REFERRING PROVIDER: Dr. Edmonia Lynch   OT End of Session - 07/21/22 0904     Visit Number 6    Number of Visits 8    Date for OT Re-Evaluation 07/30/22    Authorization Type [employer], no copay    Authorization Time Period no visit limi    OT Start Time 0902    OT Stop Time 0940    OT Time Calculation (min) 38 min    Activity Tolerance Patient tolerated treatment well    Behavior During Therapy WFL for tasks assessed/performed               Past Medical History:  Diagnosis Date   Allergy    seasonal   Hypertension    Past Surgical History:  Procedure Laterality Date   BREAST BIOPSY     left/benign   BREAST ENHANCEMENT SURGERY     BREAST SURGERY     implants removed   CESAREAN SECTION     COLONOSCOPY     POLYPECTOMY     Patient Active Problem List   Diagnosis Date Noted   Esophageal reflux 08/30/2014   Essential hypertension, benign 08/04/2013   Palpitations 08/04/2013    ONSET DATE: several weeks  REFERRING DIAG: left shoulder impingement  THERAPY DIAG:  Acute pain of left shoulder  Other symptoms and signs involving the musculoskeletal system  Rationale for Evaluation and Treatment Rehabilitation  SUBJECTIVE:   SUBJECTIVE STATEMENT: S: "I am still having some pain with certain movements but not as intense."  Pt accompanied by: self  PERTINENT HISTORY: Pt is a 61 y/o female presenting with left shoulder pain that has been present for several weeks. No known cause, x-ray negative for acute changes. Pt has pain primarily with certain movements.   PRECAUTIONS: None  WEIGHT BEARING RESTRICTIONS No  PAIN:  Are you having pain? No and only with reaching   OBJECTIVE:    UPPER EXTREMITY ROM     Active ROM Left eval  Shoulder flexion 141  Shoulder abduction 124   Shoulder internal rotation 90  Shoulder external rotation 52  (Blank rows = not tested)   UPPER EXTREMITY MMT:     MMT Left eval  Shoulder flexion 4+/5  Shoulder abduction 4/5  Shoulder internal rotation 5/5  Shoulder external rotation 5/5  (Blank rows = not tested)    TODAY'S TREATMENT:  07/21/22 -Manual Therapy: Myofascial release, soft tissue mobilization, and trigger point of trapezius, shoulder girdle, subscapular area, and scapula to decrease pain and fascial restrictions and increase joint ROM  -Theraband Strengthening  -Row, extension, 1x12, blue theraband  -abducted ir/er, 1x10, red theraband  -Bent over row, 8#, 1x10 -Dumbbell front raise and bent lateral raise, 1x10, 3#  -IR/er wall stretch -Wall angels, 1x10   07/19/22 -Manual Therapy: Myofascial release, soft tissue mobilization, and trigger point of trapezius, shoulder girdle, subscapular area, and scapula to decrease pain and fascial restrictions and increase joint ROM -Scapular strengthening: 1x12 prone extension with palms down, 3 second hold -Sidelying IR/er, 2#, 1x12  -Sidelying abduction, 2# to 90 degrees, 1x12 -Ktape: two I strips applied. First anchored at anterior shoulder, 75% stretch to posterior medial shoulder blade. Second anchored above mid clavicle, 50% stretch to medial shoulder blade. -UBE, level 1, 2 minutes forward, 1 minute reverse, 4.0 speed -Theraband Strengthening  -Row, extension, 1x12, blue theraband  -  abducted ir/er, 1x10  -chest pull apart, eyllow, 1x12 -Proximal shoulder strengthening: paddles, crisscross, circles in both directions, x 30" each direction   07/13/22 -Moist heat applied to upper trapezius to reduce fascial restrictions and pain -Scapular strengthening: 1x10 prone extension with palms down, 3 second hold -Sidelying IR/er, 2#  -Sidelying abduction, 2# to 90 degrees -Ktape: two I strips applied. First anchored at anterior shoulder, 75% stretch to posterior medial  shoulder blade. Second anchored above mid clavicle, 50% stretch to medial shoulder blade. -Red theraband chest pulls, 1x10, 2 second hold at end  -Wall angels, 1x10 -Scapular strengthening, 1x10 blue theraband row and extension   PATIENT EDUCATION: Education details: Review HEP Person educated: Patient Education method: Consulting civil engineer, Demonstration, and Handouts Education comprehension: verbalized understanding and returned demonstration   HOME EXERCISE PROGRAM: Eval: shoulder stretches 8/10: Shoulder Isometrics, red theraband scapular strengthening  GOALS: Goals reviewed with patient? Yes  SHORT TERM GOALS: Target date: 08/18/2022    Pt will be provided with and educated on HEP to improve ability to use LUE during ADLs as non-dominant.   Goal status: IN PROGRESS  2.  Pt will decrease pain in LUE to 3/10 or less to improve ability to sleep in preferred position for 3+ hours without waking due to pain.  Goal status: IN PROGRESS  3.  Pt will increase A/ROM in LUE by 10+ degrees to improve mobility required for dressing tasks such as hooking bra behind back.   Goal status: IN PROGRESS  4.  Pt will decrease fascial restrictions in LUE to min amounts or less to improve functional mobility required for reaching overhead and behind back during ADLs.   Goal status: IN PROGRESS    ASSESSMENT:  CLINICAL IMPRESSION: Pt presenting today with ktape donned, continues to report a benefit. Instructed pt to allow for one day with no tape to allow the skin a break. Added prolonged stretching in abducted IR/er, some pain with er. Continued to work on strengthening shoulder musculature with an emphasis on scapular strength. Manual therapy to address remaining fascial restriction, significantly less restriction than previous sessions. Therapist cuing throughout for form and muscle recruitment with tactile tapping.      PLAN: OT FREQUENCY: 2x/week  OT DURATION: 4 weeks  PLANNED  INTERVENTIONS: self care/ADL training, therapeutic exercise, therapeutic activity, manual therapy, passive range of motion, electrical stimulation, ultrasound, moist heat, cryotherapy, and patient/family education  CONSULTED AND AGREED WITH PLAN OF CARE: Patient  PLAN FOR NEXT SESSION: continue with myofascial release, continue A/ROM and scapular strengthening with therabands. Trial overhead lacing, ball on wall. KTAPE. Resisted IR/er, manual work to subscapularis/axillary region.    [address] [place], OTR/L [phone]  07/21/2022, 1:52 PM

## 2022-08-02 ENCOUNTER — Ambulatory Visit (HOSPITAL_COMMUNITY): Payer: 59 | Attending: Orthopedic Surgery

## 2022-08-02 ENCOUNTER — Encounter (HOSPITAL_COMMUNITY): Payer: Self-pay

## 2022-08-02 DIAGNOSIS — M25512 Pain in left shoulder: Secondary | ICD-10-CM | POA: Diagnosis present

## 2022-08-02 DIAGNOSIS — R29898 Other symptoms and signs involving the musculoskeletal system: Secondary | ICD-10-CM | POA: Insufficient documentation

## 2022-08-02 NOTE — Therapy (Signed)
OUTPATIENT OCCUPATIONAL THERAPY ORTHO TREATMENT  Patient Name: Samantha George MRN: [id number] DOB:Sep 18, 1961, 61 y.o., female Today's Date: 08/02/2022  PCP: Dr. Celene Squibb REFERRING PROVIDER: Dr. Edmonia Lynch   OT End of Session - 08/02/22 0821     Visit Number 7    Number of Visits 8    Date for OT Re-Evaluation 07/30/22    Authorization Type [employer], no copay    Authorization Time Period no visit limi    OT Start Time [phone]    OT Stop Time 0907    OT Time Calculation (min) 39 min    Activity Tolerance Patient tolerated treatment well    Behavior During Therapy [facility] for tasks assessed/performed               Past Medical History:  Diagnosis Date   Allergy    seasonal   Hypertension    Past Surgical History:  Procedure Laterality Date   BREAST BIOPSY     left/benign   BREAST ENHANCEMENT SURGERY     BREAST SURGERY     implants removed   CESAREAN SECTION     COLONOSCOPY     POLYPECTOMY     Patient Active Problem List   Diagnosis Date Noted   Esophageal reflux 08/30/2014   Essential hypertension, benign 08/04/2013   Palpitations 08/04/2013    ONSET DATE: several weeks  REFERRING DIAG: left shoulder impingement  THERAPY DIAG:  Acute pain of left shoulder  Other symptoms and signs involving the musculoskeletal system  Rationale for Evaluation and Treatment Rehabilitation  SUBJECTIVE:   SUBJECTIVE STATEMENT: S: "It's about the same. I can sleep on it a little better but when I move it certain ways it hurts."  Pt accompanied by: self  PERTINENT HISTORY: Pt is a 61 y/o female presenting with left shoulder pain that has been present for several weeks. No known cause, x-ray negative for acute changes. Pt has pain primarily with certain movements.   PRECAUTIONS: None  WEIGHT BEARING RESTRICTIONS No  PAIN:  Are you having pain? No and only with reaching   OBJECTIVE:    UPPER EXTREMITY ROM     Active ROM Left eval Left  9/5  Shoulder flexion  141   156  Shoulder abduction 124 149  Shoulder internal rotation 90 90  Shoulder external rotation 52 69  (Blank rows = not tested)   UPPER EXTREMITY MMT:     MMT Left eval Left  9/5  Shoulder flexion 4+/5 4+/5  Shoulder abduction 4/5 4+/5  Shoulder internal rotation 5/5 5/5  Shoulder external rotation 5/5 5/5  (Blank rows = not tested)    TODAY'S TREATMENT:    08/02/22 -Manual Therapy: Myofascial release, soft tissue mobilization, and trigger point of trapezius, shoulder girdle, subscapular area, and scapula to decrease pain and fascial restrictions and increase joint ROM   -Ktape: two I strips applied. First anchored at anterior shoulder, 75% stretch to posterior medial shoulder blade. Second anchored above mid clavicle, 50% stretch to medial shoulder blade. -Theraband Strengthening: 1x15, red theraband, ER/er, shoulder flexion, shoulder abduction, horizontal abduction -Theraband Strengthening: 1x15, blue, row, extension -Prone shoulder extension, 2x15 -Sleeper Stretch, 2x10 reps with 5 second hold  -UBE: level 2, 3 minutes forward, 1 minute reverse     07/21/22 -Manual Therapy: Myofascial release, soft tissue mobilization, and trigger point of trapezius, shoulder girdle, subscapular area, and scapula to decrease pain and fascial restrictions and increase joint ROM  -Theraband Strengthening  -Row, extension, 1x12, blue theraband  -  abducted ir/er, 1x10, red theraband  -Bent over row, 8#, 1x10 -Dumbbell front raise and bent lateral raise, 1x10, 3#  -IR/er wall stretch -Wall angels, 1x10   07/19/22 -Manual Therapy: Myofascial release, soft tissue mobilization, and trigger point of trapezius, shoulder girdle, subscapular area, and scapula to decrease pain and fascial restrictions and increase joint ROM -Scapular strengthening: 1x12 prone extension with palms down, 3 second hold -Sidelying IR/er, 2#, 1x12  -Sidelying abduction, 2# to 90 degrees, 1x12 -Ktape: two I strips  applied. First anchored at anterior shoulder, 75% stretch to posterior medial shoulder blade. Second anchored above mid clavicle, 50% stretch to medial shoulder blade. -UBE, level 1, 2 minutes forward, 1 minute reverse, 4.0 speed -Theraband Strengthening  -Row, extension, 1x12, blue theraband  -abducted ir/er, 1x10  -chest pull apart, eyllow, 1x12 -Proximal shoulder strengthening: paddles, crisscross, circles in both directions, x 30" each direction    PATIENT EDUCATION: Education details: Shoulder Strengthening (resisted theraband IR/er, theraband flexion/abduction, theraband horizontal abduction, prone extension), sleeper stretch  Person educated: Patient Education method: Explanation, Demonstration, and Handouts Education comprehension: verbalized understanding and returned demonstration   HOME EXERCISE PROGRAM: Eval: shoulder stretches 8/10: Shoulder Isometrics, red theraband scapular strengthening 9/5: Shoulder Strengthening (resisted theraband IR/er, theraband flexion/abduction, theraband horizontal abduction, prone extension), sleeper stretch   GOALS: Goals reviewed with patient? Yes  SHORT TERM GOALS: Target date: 08/30/2022    Pt will be provided with and educated on HEP to improve ability to use LUE during ADLs as non-dominant.   Goal status: IN PROGRESS  2.  Pt will decrease pain in LUE to 3/10 or less to improve ability to sleep in preferred position for 3+ hours without waking due to pain.  Goal status: IN PROGRESS  3.  Pt will increase A/ROM in LUE by 10+ degrees to improve mobility required for dressing tasks such as hooking bra behind back.   Goal status: IN PROGRESS  4.  Pt will decrease fascial restrictions in LUE to min amounts or less to improve functional mobility required for reaching overhead and behind back during ADLs.   Goal status: IN PROGRESS    ASSESSMENT:  CLINICAL IMPRESSION: Pt reports that she continues to have some mild pain with  certain movements, although the pain is momentary and far less intense than it was prior to starting therapy. She reports completing her HEP and that she plans to return to [facility] next week. Pt continues with moderate fascial restrictions throughout upper quadrant but presents with increased ROM and strength today. Added several exercises to HEP with pt able to demonstrate with good form, therapist providing demonstration and initial moderate tactile cuing for proper form. [employer] as pt reports benefit and cues for posture positioning.      PLAN: OT FREQUENCY: 2x/week  OT DURATION: 4 weeks  PLANNED INTERVENTIONS: self care/ADL training, therapeutic exercise, therapeutic activity, manual therapy, passive range of motion, electrical stimulation, ultrasound, moist heat, cryotherapy, and patient/family education  CONSULTED AND AGREED WITH PLAN OF CARE: Patient  PLAN FOR NEXT SESSION: continue with myofascial release, continue A/ROM and scapular strengthening with therabands. Trial overhead lacing, ball on wall. KTAPE. Resisted IR/er, manual work to subscapularis/axillary region.    Flonnie Hailstone, [place], OTR/L [phone]  08/02/2022, 9:19 AM

## 2022-08-04 ENCOUNTER — Encounter (HOSPITAL_COMMUNITY): Payer: Self-pay

## 2022-08-04 ENCOUNTER — Ambulatory Visit (HOSPITAL_COMMUNITY): Payer: 59

## 2022-08-04 DIAGNOSIS — M25512 Pain in left shoulder: Secondary | ICD-10-CM

## 2022-08-04 DIAGNOSIS — R29898 Other symptoms and signs involving the musculoskeletal system: Secondary | ICD-10-CM

## 2022-08-04 NOTE — Therapy (Signed)
OUTPATIENT OCCUPATIONAL THERAPY ORTHO TREATMENT and Discharge Summary   Patient Name: Samantha George MRN: [id number] DOB:[birth year], 61 y.o., female Today's Date: 08/04/2022  PCP: Dr. Celene Squibb REFERRING PROVIDER: Dr. Edmonia Lynch   OCCUPATIONAL THERAPY DISCHARGE SUMMARY  Visits from Start of Care: 8  Current functional level related to goals / functional outcomes: Pt has met 4/4 goals. She reports that she is able to complete all ADLs/IADLs with only momentary pain occurring with few movements.    Remaining deficits: Pt remains with some decreased shoulder strength, although functional for all required daily activities    Education / Equipment: HEP with theraband   Plan: Patient agrees to discharge. Patient is being discharged due to meeting the stated rehab goals.        OT End of Session - 08/04/22 0818     Visit Number 8    Number of Visits 8    Date for OT Re-Evaluation 07/30/22    Authorization Type [employer], no copay    Authorization Time Period no visit limi    OT Start Time 0818    OT Stop Time 0856    OT Time Calculation (min) 38 min    Activity Tolerance Patient tolerated treatment well    Behavior During Therapy WFL for tasks assessed/performed               Past Medical History:  Diagnosis Date   Allergy    seasonal   Hypertension    Past Surgical History:  Procedure Laterality Date   BREAST BIOPSY     left/benign   BREAST ENHANCEMENT SURGERY     BREAST SURGERY     implants removed   CESAREAN SECTION     COLONOSCOPY     POLYPECTOMY     Patient Active Problem List   Diagnosis Date Noted   Esophageal reflux 08/30/2014   Essential hypertension, benign 08/04/2013   Palpitations 08/04/2013    ONSET DATE: several weeks  REFERRING DIAG: left shoulder impingement  THERAPY DIAG:  Acute pain of left shoulder  Other symptoms and signs involving the musculoskeletal system  Rationale for Evaluation and Treatment  Rehabilitation  SUBJECTIVE:   SUBJECTIVE STATEMENT: S: "It's about the same. I can sleep on it a little better but when I move it certain ways it hurts."  Pt accompanied by: self  PERTINENT HISTORY: Pt is a 61 y/o female presenting with left shoulder pain that has been present for several weeks. No known cause, x-ray negative for acute changes. Pt has pain primarily with certain movements.   PRECAUTIONS: None  WEIGHT BEARING RESTRICTIONS No  PAIN:  Are you having pain? No and only with reaching   OBJECTIVE:    UPPER EXTREMITY ROM     Active ROM Left eval Left  9/5 Left  9/7  Shoulder flexion 141   156 160  Shoulder abduction 124 149 151  Shoulder internal rotation 90 90 90  Shoulder external rotation 52 69 70  (Blank rows = not tested)   UPPER EXTREMITY MMT:     MMT Left eval Left  9/5 Left 9/7  Shoulder flexion 4+/5 4+/5 4+/5  Shoulder abduction 4/5 4+/5 4+/5  Shoulder internal rotation 5/5 5/5   Shoulder external rotation 5/5 5/5   (Blank rows = not tested)   DASH: 08/04/22: 9.09  TODAY'S TREATMENT:    08/04/22 -Ktape: two I strips applied. First anchored at anterior shoulder, 75% stretch to posterior medial shoulder blade. Second anchored above mid clavicle, 50%  stretch to medial shoulder blade. -Sleeper Stretch, 2x10 reps with 5 second hold   -Theraband Strengthening: 1x15, red theraband, IR/er, shoulder flexion, shoulder abduction, horizontal abduction -Theraband Strengthening: 1x15, blue, row, extension   -Wall stretch: forward flexion and pectoralis stretch, 2x20"    -Theraband strengthening, 1x15, elbow flexion/extension, yellow    08/02/22 -Manual Therapy: Myofascial release, soft tissue mobilization, and trigger point of trapezius, shoulder girdle, subscapular area, and scapula to decrease pain and fascial restrictions and increase joint ROM   -Ktape: two I strips applied. First anchored at anterior shoulder, 75% stretch to posterior medial shoulder  blade. Second anchored above mid clavicle, 50% stretch to medial shoulder blade. -Theraband Strengthening: 1x15, red theraband, IR/er, shoulder flexion, shoulder abduction, horizontal abduction -Theraband Strengthening: 1x15, blue, row, extension -Prone shoulder extension, 2x15 -Sleeper Stretch, 2x10 reps with 5 second hold  -UBE: level 2, 3 minutes forward, 1 minute reverse     07/21/22 -Manual Therapy: Myofascial release, soft tissue mobilization, and trigger point of trapezius, shoulder girdle, subscapular area, and scapula to decrease pain and fascial restrictions and increase joint ROM  -Theraband Strengthening  -Row, extension, 1x12, blue theraband  -abducted ir/er, 1x10, red theraband  -Bent over row, 8#, 1x10 -Dumbbell front raise and bent lateral raise, 1x10, 3#  -IR/er wall stretch -Wall angels, 1x10    PATIENT EDUCATION: Education details: Theraband elbow flexion/extension  Person educated: Patient Education method: Explanation, Demonstration, and Handouts Education comprehension: verbalized understanding and returned demonstration   HOME EXERCISE PROGRAM: Eval: shoulder stretches 8/10: Shoulder Isometrics, red theraband scapular strengthening 9/5: Shoulder Strengthening (resisted theraband IR/er, theraband flexion/abduction, theraband horizontal abduction, prone extension), sleeper stretch  9/7: Theraband elbow flexion/extension   GOALS: Goals reviewed with patient? Yes  SHORT TERM GOALS: Target date: 09/01/2022    Pt will be provided with and educated on HEP to improve ability to use LUE during ADLs as non-dominant.   Goal status: MET  2.  Pt will decrease pain in LUE to 3/10 or less to improve ability to sleep in preferred position for 3+ hours without waking due to pain.  Goal status: MET  3.  Pt will increase A/ROM in LUE by 10+ degrees to improve mobility required for dressing tasks such as hooking bra behind back.   Goal status: MET  4.  Pt will  decrease fascial restrictions in LUE to min amounts or less to improve functional mobility required for reaching overhead and behind back during ADLs.   Goal status: MET    ASSESSMENT:  CLINICAL IMPRESSION: Pt reports that she feels significantly better, reporting no significant restrictions with ROM and strength, able to complete all necessary/desired activities. Measurements taken for discharge with significant improvements noted with ROM, pain and functional outcome measure. Reviewed all HEP exercises with pt able to complete with minimal therapist cuing. Applied ktape and educated pt on wear time. Pt appropriate and agreeable for discharge.      PLAN: OT FREQUENCY: Discharge    Mathews Robinsons, OTR/L [phone]  08/04/2022, 2:28 PM

## 2022-10-28 ENCOUNTER — Other Ambulatory Visit (HOSPITAL_COMMUNITY): Payer: Self-pay | Admitting: Internal Medicine

## 2022-10-28 DIAGNOSIS — E782 Mixed hyperlipidemia: Secondary | ICD-10-CM

## 2022-12-06 ENCOUNTER — Ambulatory Visit (HOSPITAL_COMMUNITY)
Admission: RE | Admit: 2022-12-06 | Discharge: 2022-12-06 | Disposition: A | Discharge status: Home / Self Care | Payer: 59 | Source: Ambulatory Visit | Attending: Internal Medicine | Admitting: Internal Medicine

## 2022-12-06 DIAGNOSIS — E782 Mixed hyperlipidemia: Secondary | ICD-10-CM
